# Patient Record
Sex: Male | Born: 1985 | Hispanic: No | Marital: Married | Smoking: Former smoker
Health system: Southern US, Community
[De-identification: ages and names within clinical notes are randomized; demographics above are authoritative.]

## PROBLEM LIST (undated history)

## (undated) DIAGNOSIS — I10 Essential (primary) hypertension: Secondary | ICD-10-CM

## (undated) DIAGNOSIS — R7401 Elevation of levels of liver transaminase levels: Secondary | ICD-10-CM

## (undated) DIAGNOSIS — R740 Nonspecific elevation of levels of transaminase and lactic acid dehydrogenase [LDH]: Secondary | ICD-10-CM

## (undated) DIAGNOSIS — E785 Hyperlipidemia, unspecified: Secondary | ICD-10-CM

## (undated) DIAGNOSIS — K625 Hemorrhage of anus and rectum: Secondary | ICD-10-CM

## (undated) HISTORY — DX: Hemorrhage of anus and rectum: K62.5

## (undated) HISTORY — DX: Nonspecific elevation of levels of transaminase and lactic acid dehydrogenase (ldh): R74.0

## (undated) HISTORY — PX: TONSILLECTOMY: SUR1361

## (undated) HISTORY — DX: Essential (primary) hypertension: I10

## (undated) HISTORY — DX: Elevation of levels of liver transaminase levels: R74.01

## (undated) HISTORY — DX: Hyperlipidemia, unspecified: E78.5

---

## 2011-01-19 DIAGNOSIS — E7800 Pure hypercholesterolemia, unspecified: Secondary | ICD-10-CM | POA: Insufficient documentation

## 2013-06-13 DIAGNOSIS — K649 Unspecified hemorrhoids: Secondary | ICD-10-CM | POA: Insufficient documentation

## 2013-06-13 DIAGNOSIS — R7989 Other specified abnormal findings of blood chemistry: Secondary | ICD-10-CM | POA: Insufficient documentation

## 2014-04-06 DIAGNOSIS — I10 Essential (primary) hypertension: Secondary | ICD-10-CM | POA: Insufficient documentation

## 2015-10-23 DIAGNOSIS — B009 Herpesviral infection, unspecified: Secondary | ICD-10-CM | POA: Insufficient documentation

## 2017-05-25 DIAGNOSIS — M542 Cervicalgia: Secondary | ICD-10-CM | POA: Insufficient documentation

## 2018-03-15 DIAGNOSIS — E782 Mixed hyperlipidemia: Secondary | ICD-10-CM | POA: Diagnosis not present

## 2018-03-15 DIAGNOSIS — I10 Essential (primary) hypertension: Secondary | ICD-10-CM | POA: Diagnosis not present

## 2018-03-21 DIAGNOSIS — Z0000 Encounter for general adult medical examination without abnormal findings: Secondary | ICD-10-CM | POA: Diagnosis not present

## 2018-03-21 DIAGNOSIS — M542 Cervicalgia: Secondary | ICD-10-CM | POA: Diagnosis not present

## 2018-03-21 DIAGNOSIS — M25511 Pain in right shoulder: Secondary | ICD-10-CM | POA: Diagnosis not present

## 2018-03-21 DIAGNOSIS — R0683 Snoring: Secondary | ICD-10-CM | POA: Diagnosis not present

## 2018-06-01 ENCOUNTER — Ambulatory Visit: Payer: 59 | Admitting: Neurology

## 2018-06-01 ENCOUNTER — Encounter: Payer: Self-pay | Admitting: Neurology

## 2018-06-01 VITALS — BP 127/92 | HR 86 | Ht 65.0 in | Wt 159.0 lb

## 2018-06-01 DIAGNOSIS — G478 Other sleep disorders: Secondary | ICD-10-CM

## 2018-06-01 DIAGNOSIS — E663 Overweight: Secondary | ICD-10-CM | POA: Diagnosis not present

## 2018-06-01 DIAGNOSIS — R0681 Apnea, not elsewhere classified: Secondary | ICD-10-CM

## 2018-06-01 DIAGNOSIS — R0683 Snoring: Secondary | ICD-10-CM | POA: Diagnosis not present

## 2018-06-01 DIAGNOSIS — F514 Sleep terrors [night terrors]: Secondary | ICD-10-CM

## 2018-06-01 DIAGNOSIS — R51 Headache: Secondary | ICD-10-CM

## 2018-06-01 DIAGNOSIS — R519 Headache, unspecified: Secondary | ICD-10-CM

## 2018-06-01 NOTE — Progress Notes (Signed)
Subjective:    Patient ID: Curtis Stephens is a 32 y.o. male.  HPI     Curtis FoleySaima Aleynah Rocchio, MD, PhD [facility] [address], [address] [address] [place], [place] [address]  Dear Dr. Nicholos Stephens,  I saw your patient, Curtis Stephens, upon your kind request in my neurologic clinic today for initial consultation of his sleep disorder, in particular, concern for underlying obstructive sleep apnea. The patient is accompanied by his wife today. As you know, Mr. Curtis Stephens is a 32 year old right-handed gentleman with an underlying medical history of hypertension, smoking, hyperlipidemia, reflux disease, neck pain, right shoulder pain, and mildly overweight state, who reports snoring and witnessed apneas per wife's report. He denies frank sleepiness and tries to make enough time for sleep but he has noticed in the past few years that his sleep is not as restful.he has also gained weight in the past couple of years. I reviewed your office note from 03/21/2018, which you kindly included. He is married and lives with his wife, works from home. They have no children. He smokes 4-5 cigarettes a week. He does not utilize alcohol currently. He drinks caffeine in the form of coffee, 1-2 cups per day on average. His Epworth sleepiness score is 6 out of 24, fatigue score is 26 out of 63. He has a dry mouth in AM.  He has no known FHx of OSA. He denies night to night nocturia but has had occasional morning headaches. He also has neck pain. He is trying to find the right pillow. He has woken up in the state of panic and confusion. This happens fairly frequently especially if his wife goes to bed after him and he is awakened or disturbed by any noise, he will yell out can have a blank stare, and if anything will recall the state of panic but no details in the mornings. He does dream. He tries to make enough time for sleep, generally, he is in bed by 10 or 10:30, rise time is around 7 or 7:30. They don't have any  pets, he does not watch TV in the bedroom. He had a tonsillectomy as a child, does not endorse restless leg symptoms and is not known to twitch or kick in his sleep. He does have occasional nasal congestion.  His Past Medical History Is Significant For: Past Medical History:  Diagnosis Date  . Elevated transaminase level   . Hyperlipidemia   . Hypertension   . Rectal bleeding     His Past Surgical History Is Significant For:  His Family History Is Significant For: Family History  Problem Relation Age of Onset  . Diabetes Father   . Hypertension Father     His Social History Is Significant For: Social History   Socioeconomic History  . Marital status: Married    Spouse name: Not on file  . Number of children: Not on file  . Years of education: Not on file  . Highest education level: Not on file  Occupational History  . Not on file  Social Needs  . Financial resource strain: Not on file  . Food insecurity:    Worry: Not on file    Inability: Not on file  . Transportation needs:    Medical: Not on file    Non-medical: Not on file  Tobacco Use  . Smoking status: Current Some Day Smoker  . Smokeless tobacco: Never Used  Substance and Sexual Activity  . Alcohol use: Not Currently  . Drug use:  Never  . Sexual activity: Not on file  Lifestyle  . Physical activity:    Days per week: Not on file    Minutes per session: Not on file  . Stress: Not on file  Relationships  . Social connections:    Talks on phone: Not on file    Gets together: Not on file    Attends religious service: Not on file    Active member of club or organization: Not on file    Attends meetings of clubs or organizations: Not on file    Relationship status: Not on file  Other Topics Concern  . Not on file  Social History Narrative  . Not on file    His Allergies Are:  Allergies  Allergen Reactions  . Nsaids   . Crestor [Rosuvastatin Calcium]   :   His Current Medications Are:   Outpatient Encounter Medications as of 06/01/2018  Medication Sig  . ezetimibe (ZETIA) 10 MG tablet Take 10 mg by mouth daily.  Marland Kitchen lisinopril (PRINIVIL,ZESTRIL) 20 MG tablet Take 20 mg by mouth daily.  Marland Kitchen omeprazole (PRILOSEC) 40 MG capsule Take 40 mg by mouth daily.   No facility-administered encounter medications on file as of 06/01/2018.   :  Review of Systems:  Out of a complete 14 point review of systems, all are reviewed and negative with the exception of these symptoms as listed below: Review of Systems  Neurological:       Pt presents today to discuss his sleep. Pt has never had a sleep study but does endorse snoring.  Epworth Sleepiness Scale 0= would never doze 1= slight chance of dozing 2= moderate chance of dozing 3= high chance of dozing  Sitting and reading: 3 Watching TV: 1 Sitting inactive in a public place (ex. Theater or meeting): 0 As a passenger in a car for an hour without a break: 0 Lying down to rest in the afternoon: 2 Sitting and talking to someone: 0 Sitting quietly after lunch (no alcohol): 0 In a car, while stopped in traffic: 0 Total: 6     Objective:  Neurological Exam  Physical Exam Physical Examination:   Vitals:   06/01/18 1327  BP: (!) 127/92  Pulse: 86    General Examination: The patient is a very pleasant 32 y.o. male in no acute distress. He appears well-developed and well-nourished and well groomed.   HEENT: Normocephalic, atraumatic, pupils are equal, round and reactive to light and accommodation. Corrective eyeglasses in place. Extraocular tracking is good without limitation to gaze excursion or nystagmus noted. Normal smooth pursuit is noted. Hearing is grossly intact. Face is symmetric with normal facial animation and normal facial sensation. Speech is clear with no dysarthria noted. There is no hypophonia. There is no lip, neck/head, jaw or voice tremor. Neck is supple with full range of passive and active motion. There are no  carotid bruits on auscultation. Oropharynx exam reveals: mild mouth dryness, adequate dental hygiene and moderate airway crowding, due to small airway entryAnd larger uvula. Mallampati is class II. Tonsils are absent. Neck circumference is 16-1/2 inches.He has a minimal overbite, slight malocclusion. On nasal inspection he has no significant deviated septum but some mucosal swelling and nasal congestion noted, right much more than left. He reports that he currently has a mild cold.  Chest: Clear to auscultation without wheezing, rhonchi or crackles noted.  Heart: S1+S2+0, regular and normal without murmurs, rubs or gallops noted.   Abdomen: Soft, non-tender and non-distended with normal  bowel sounds appreciated on auscultation.  Extremities: There is no pitting edema in the distal lower extremities bilaterally.  Skin: Warm and dry without trophic changes noted.  Musculoskeletal: exam reveals no obvious joint deformities, tenderness or joint swelling or erythema.   Neurologically:  Mental status: The patient is awake, alert and oriented in all 4 spheres. His immediate and remote memory, attention, language skills and fund of knowledge are appropriate. There is no evidence of aphasia, agnosia, apraxia or anomia. Speech is clear with normal prosody and enunciation. Thought process is linear. Mood is normal and affect is normal.  Cranial nerves II - XII are as described above under HEENT exam. In addition: shoulder shrug is normal with equal shoulder height noted. Motor exam: Normal bulk, strength and tone is noted. There is no drift, tremor or rebound. Romberg is negative. Fine motor skills and coordination: intact grossly.   Cerebellar testing: No dysmetria or intention tremor. There is no truncal or gait ataxia.  Sensory exam: intact to light touch in the upper and lower extremities.  Gait, station and balance: He stands easily. No veering to one side is noted. No leaning to one side is noted.  Posture is age-appropriate and stance is narrow based. Gait shows normal stride length and normal pace. No problems turning are noted. tandem walk is unremarkable.   Assessment and Plan:  In summary, Bosco Curtis Stephens is a very pleasant 32 y.o.-year old male with an underlying medical history of hypertension, smoking, hyperlipidemia, reflux disease, neck pain, right shoulder pain, and mildly overweight state, whose history and physical exam are concerning for obstructive sleep apnea (OSA). I had a long chat with the patient and his wife about my findings and the diagnosis of OSA, its prognosis and treatment options. We talked about medical treatments, surgical interventions and non-pharmacological approaches. I explained in particular the risks and ramifications of untreated moderate to severe OSA, especially with respect to developing cardiovascular disease down the Road, including congestive heart failure, difficult to treat hypertension, cardiac arrhythmias, or stroke. Even type 2 diabetes has, in part, been linked to untreated OSA. Symptoms of untreated OSA include daytime sleepiness, memory problems, mood irritability and mood disorder such as depression and anxiety, lack of energy, as well as recurrent headaches, especially morning headaches. We talked about the importance of smoking cessation, and trying to maintain a healthy lifestyle in general, as well as the importance of weight control. I encouraged the patient to eat healthy, exercise daily and keep well hydrated, to keep a scheduled bedtime and wake time routine, to not skip any meals and eat healthy snacks in between meals. I advised the patient not to drive when feeling sleepy. I recommended the following at this time: sleep study with potential positive airway pressure titration. (We will score hypopneas at 4%).   I explained the sleep test procedure to the patient and also outlined possible surgical and non-surgical treatment options of OSA,  including the use of a custom-made dental device (which would require a referral to a specialist dentist or oral surgeon), upper airway surgical options, such as pillar implants, radiofrequency surgery, tongue base surgery, and UPPP (which would involve a referral to an ENT surgeon). Rarely, jaw surgery such as mandibular advancement may be considered.  I also explained the CPAP treatment option to the patient, who indicated that he would be willing to try CPAP if the need arises. I explained the importance of being compliant with PAP treatment, not only for insurance purposes but primarily to  improve His symptoms, and for the patient's long term health benefit, including to reduce His cardiovascular risks. I answered all their questions today and the patient and his wife were in agreement. I plan to see him back after the sleep study is completed and encouraged him to call with any interim questions, concerns, problems or updates.   Thank you very much for allowing me to participate in the care of this nice patient. If I can be of any further assistance to you please do not hesitate to call me at [phone].  Sincerely,   Curtis Foley, MD, PhD

## 2018-06-01 NOTE — Patient Instructions (Addendum)

## 2018-06-02 ENCOUNTER — Telehealth: Payer: Self-pay

## 2018-06-02 DIAGNOSIS — R0681 Apnea, not elsewhere classified: Secondary | ICD-10-CM

## 2018-06-02 NOTE — Telephone Encounter (Signed)
[employer] will deny in lab sleep study, does not meet critria. Need HST order

## 2018-06-02 NOTE — Telephone Encounter (Signed)
VO for HST from Dr. Athar received. HST order placed.  

## 2018-06-22 ENCOUNTER — Ambulatory Visit: Payer: 59 | Admitting: Neurology

## 2018-06-22 DIAGNOSIS — G4733 Obstructive sleep apnea (adult) (pediatric): Secondary | ICD-10-CM | POA: Diagnosis not present

## 2018-06-22 DIAGNOSIS — R0681 Apnea, not elsewhere classified: Secondary | ICD-10-CM

## 2018-06-30 ENCOUNTER — Telehealth: Payer: Self-pay

## 2018-06-30 NOTE — Telephone Encounter (Signed)
I called pt to discuss his sleep study results. Pt is agreeable to starting auto pap, if his insurance will cover it. He asked me to send the order to a DME to check insurance. Pt declined an appt right now, even though I advised him that he must follow up 31-90 days after starting PAP therapy. Pt will call me if he decides to start auto pap. Alternative treatments for osa other than auto pap were discussed with the pt as well. Pt is asking to pick up a copy of his sleep study report to review the data and recommendations for himself. He again was asked to call and let me know of his decision regarding treatment. Pt verbalized understanding of results. I have placed a copy of pt's sleep study at the front desk for pick up and sent the referral for auto pap to [facility].

## 2018-06-30 NOTE — Progress Notes (Signed)
Patient referred by Dr. Nicholos Johnsamachandran, seen by me on 06/01/18, HST on 06/22/18.    Please call and notify the patient that the recent home sleep test showed obstructive sleep apnea. OSA is in the moderate range, but desaturations were mild. I would like for him to consider treatment with autoPAP to see, if he feels better after treatment. To that end I recommend treatment for this in the form of autoPAP, which means, that we don't have to bring him in for a sleep study with CPAP, but will let him try an autoPAP machine at home, through a DME company (of his choice, or as per insurance requirement). The DME representative will educate him on how to use the machine, how to put the mask on, etc. I have placed an order in the chart, in case he wants to proceed. Other OSA treatment options may include avoidance of supine sleep position along with weight loss, upper airway or jaw surgery in selected patients or the use of an oral appliance in certain patients. Realistically, since he is not obese, a dental device can be considered as an alternative to autoPAP.  Please send referral, talk to patient, send report to referring MD. We will need a FU in sleep clinic for 10 weeks post-PAP set up, please arrange that with me or one of our NPs. Thanks,   Huston FoleySaima Stella Encarnacion, MD, PhD [facility])

## 2018-06-30 NOTE — Addendum Note (Signed)
Addended by: Huston FoleyATHAR, Shelanda Duvall on: 06/30/2018 08:26 AM   Modules accepted: Orders

## 2018-06-30 NOTE — Procedures (Signed)
[facility] [address] [place], [place] [address] NAME: Curtis Pancoastatel Curtis Stephens                                                                 DOB: 08/16/1986 MEDICAL RECORD NUMBER [id number]                                          DOS: 06/22/2018   REFERRING PHYSICIAN: Georgianne FickAjith Ramachandran, MD STUDY PERFORMED: Home Sleep Test HISTORY: 32 year old man with a history of hypertension, smoking, hyperlipidemia, reflux disease, neck pain, right shoulder pain, and mildly overweight state, who reports snoring and witnessed apneas per wife's report. His Epworth sleepiness score is 6 out of 24, BMI: 26.4.  STUDY RESULTS:  Total Recording Time: 8 hours, 45 minutes (valid test time: 7 hours, 29 min) Total Apnea/Hypopnea Index (AHI): 21.7/h, RDI: 30.7/h Average Oxygen Saturation: 94%, Lowest Oxygen Desaturation: 88%   Total Time Oxygen Saturation Below or at 88%: 0.2 minutes  Average Heart Rate: 94 bpm (between 88 and 100 bpm) IMPRESSION: OSA  RECOMMENDATION: This home sleep test demonstrates moderate obstructive sleep apnea (by number of events) with a total AHI of 21.7/hour and O2 nadir of 88%. Given the patient's medical history and sleep related complaints, treatment with positive airway pressure is recommended. This can be achieved in the form of an autoPAP titration/trial at home. Other OSA treatment options may include avoidance of supine sleep position along with weight loss, upper airway or jaw surgery in selected patients or the use of an oral appliance in certain patients. ENT evaluation and/or consultation with a maxillofacial surgeon or dentist may be feasible in some instances. Please note that untreated obstructive sleep apnea may carry additional perioperative morbidity. Patients with significant obstructive sleep apnea should receive perioperative PAP therapy and the surgeons and particularly the anesthesiologist should be informed of the diagnosis and the severity of the  sleep disordered breathing. The patient should be cautioned not to drive, work at heights, or operate dangerous or heavy equipment when tired or sleepy. Review and reiteration of good sleep hygiene measures should be pursued with any patient. Other causes of the patient's symptoms, including circadian rhythm disturbances, an underlying mood disorder, medication effect and/or an underlying medical problem cannot be ruled out based on this test. Clinical correlation is recommended. The patient and his referring provider will be notified of the test results. The patient [facility]. I certify that I have reviewed the raw data recording prior to the issuance of this report in accordance with the standards of the American Academy of Sleep Medicine (AASM).  Huston FoleySaima Machelle Raybon, MD, PhD [facility]) Diplomat, ABPN (Neurology and Sleep)

## 2018-06-30 NOTE — Telephone Encounter (Signed)
-----   Message from Huston FoleySaima Athar, MD sent at 06/30/2018  8:25 AM EDT ----- Patient referred by Dr. Nicholos Johnsamachandran, seen by me on 06/01/18, HST on 06/22/18.    Please call and notify the patient that the recent home sleep test showed obstructive sleep apnea. OSA is in the moderate range, but desaturations were mild. I would like for him to consider treatment with autoPAP to see, if he feels better after treatment. To that end I recommend treatment for this in the form of autoPAP, which means, that we don't have to bring him in for a sleep study with CPAP, but will let him try an autoPAP machine at home, through a DME company (of his choice, or as per insurance requirement). The DME representative will educate him on how to use the machine, how to put the mask on, etc. I have placed an order in the chart, in case he wants to proceed. Other OSA treatment options may include avoidance of supine sleep position along with weight loss, upper airway or jaw surgery in selected patients or the use of an oral appliance in certain patients. Realistically, since he is not obese, a dental device can be considered as an alternative to autoPAP.  Please send referral, talk to patient, send report to referring MD. We will need a FU in sleep clinic for 10 weeks post-PAP set up, please arrange that with me or one of our NPs. Thanks,   Huston FoleySaima Athar, MD, PhD [facility])

## 2018-07-01 ENCOUNTER — Telehealth: Payer: Self-pay | Admitting: Neurology

## 2018-07-01 NOTE — Telephone Encounter (Signed)
Patient has questions about his sleep study. Please call [phone]

## 2018-07-04 NOTE — Telephone Encounter (Signed)
I called pt to discuss. No answer, left a message asking him to call me back. 

## 2018-07-06 NOTE — Telephone Encounter (Signed)
I called pt. He has questions about sleep apnea and how a cpap treats osa, which I answered for him. He asked what lifestyle changes he could make to help his osa. I recommended avoidance of supine sleep position and maintaining a healthy weight. Pt has many more questions which I cannot address, such as what size is his airway and how much does that contribute to his osa, etc. Pt plans on starting cpap but wants an appt with Dr. Frances FurbishAthar and asked me to call him if an appt opens up. I will call pt if an appt opens up. Pt verbalized understanding.

## 2018-07-06 NOTE — Telephone Encounter (Signed)
I called pt and offered him 2 available appts for Monday with Dr. Frances FurbishAthar but pt cannot make either of those appts.

## 2018-07-07 NOTE — Telephone Encounter (Signed)
I called pt offer him an appt next week on Wednesday with Dr. Frances FurbishAthar. Pt reports that he is on the other line with [facility] and will call us back next week to discuss his cpap and scheduling an appt. Pt declined appts on Wednesday with Dr. Frances FurbishAthar at this time.

## 2019-11-13 ENCOUNTER — Other Ambulatory Visit (HOSPITAL_COMMUNITY): Payer: Self-pay | Admitting: Internal Medicine

## 2019-11-13 ENCOUNTER — Other Ambulatory Visit: Payer: Self-pay | Admitting: Internal Medicine

## 2019-11-13 DIAGNOSIS — R519 Headache, unspecified: Secondary | ICD-10-CM

## 2019-11-17 ENCOUNTER — Ambulatory Visit (HOSPITAL_COMMUNITY)
Admission: RE | Admit: 2019-11-17 | Discharge: 2019-11-17 | Disposition: A | Discharge status: Home / Self Care | Payer: 59 | Source: Ambulatory Visit | Attending: Internal Medicine | Admitting: Internal Medicine

## 2019-11-17 ENCOUNTER — Other Ambulatory Visit: Payer: Self-pay

## 2019-11-17 ENCOUNTER — Ambulatory Visit (HOSPITAL_COMMUNITY): Payer: 59

## 2019-11-17 DIAGNOSIS — R519 Headache, unspecified: Secondary | ICD-10-CM | POA: Diagnosis present

## 2019-11-17 IMAGING — CT CT HEAD W/O CM
4 series · 16 of 47 positions shown, 18 images · non-contrast
Comparison: None.

CLINICAL DATA: Headache

EXAM:
CT HEAD WITHOUT CONTRAST
TECHNIQUE: Contiguous axial images were obtained from the base of the skull
through the vertex without intravenous contrast.

[Series 2: head without · axial · non-contrast · 0.48mm/px · z∈[-92,+28]mm · 7 of 32 slices shown, 9 images]
[im 4/32  brain]
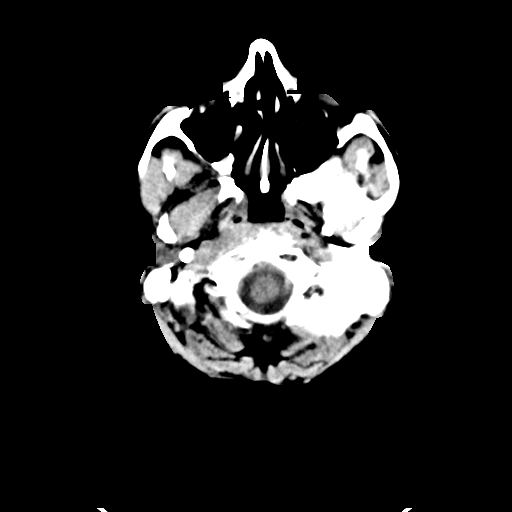
[im 4/32  bone]
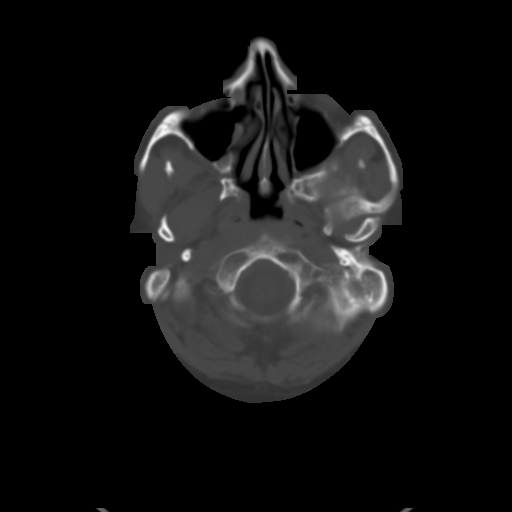
[im 8/32  brain]
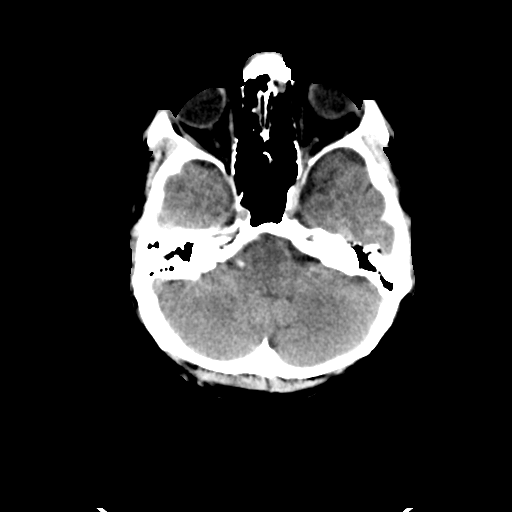
[im 12/32  brain]
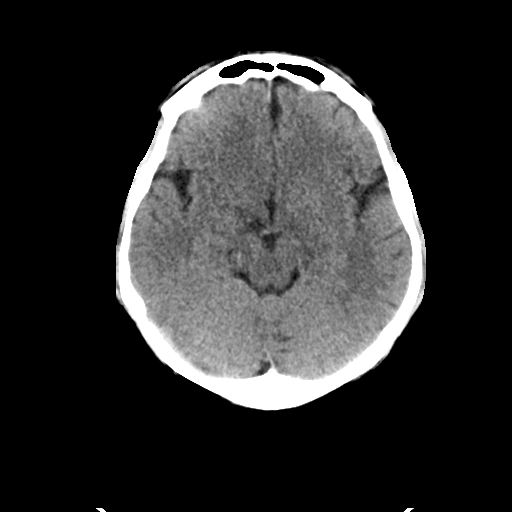
[im 16/32  brain]
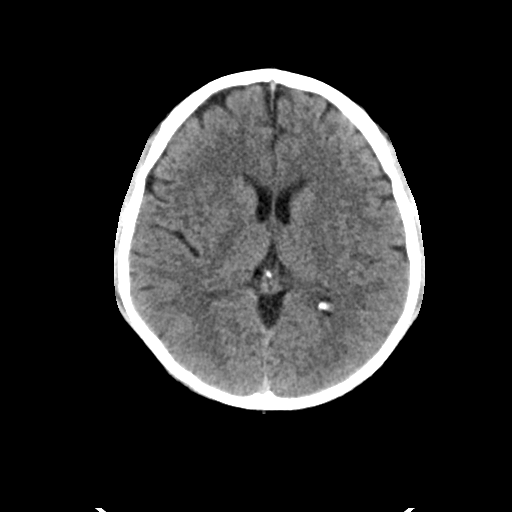
[im 20/32  brain]
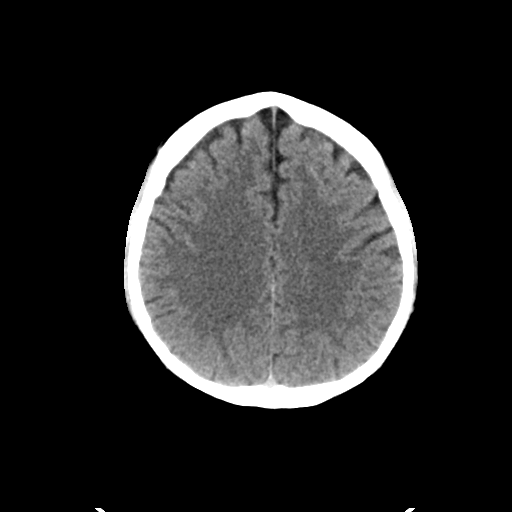
[im 20/32  bone]
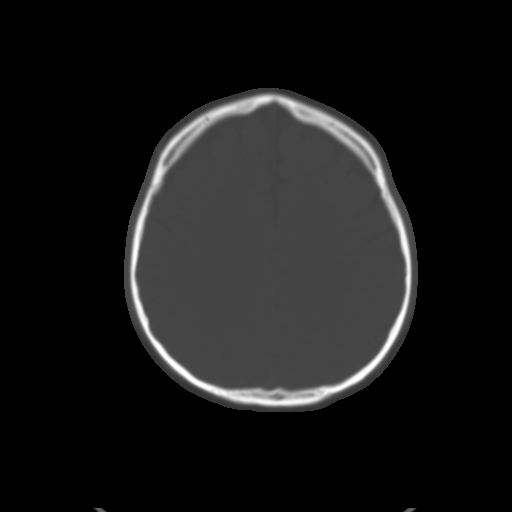
[im 24/32  brain]
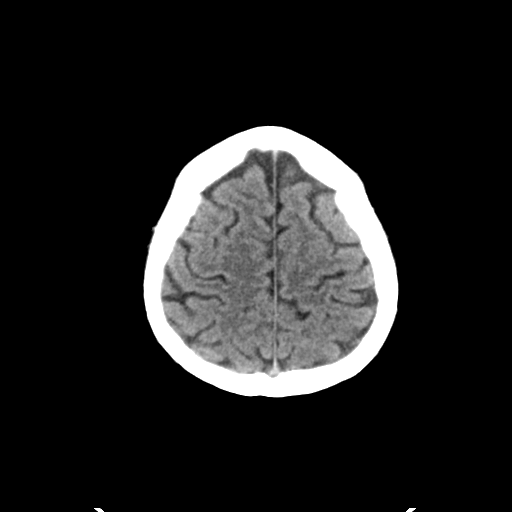
[im 28/32  brain]
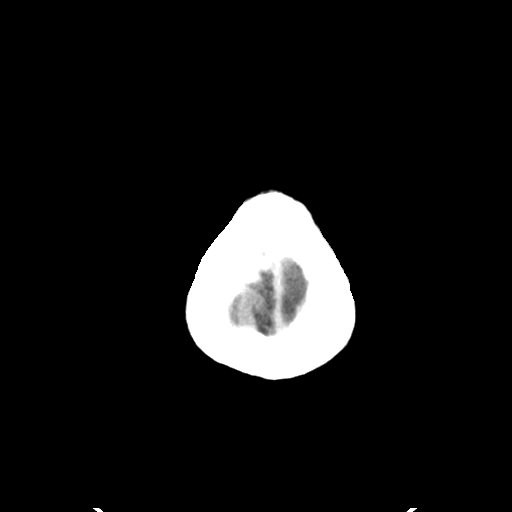

[Series 3: head bone · axial · 0.48mm/px · z∈[-93,-61]mm · 3 of 80 slices shown]
[im 8/80  bone]
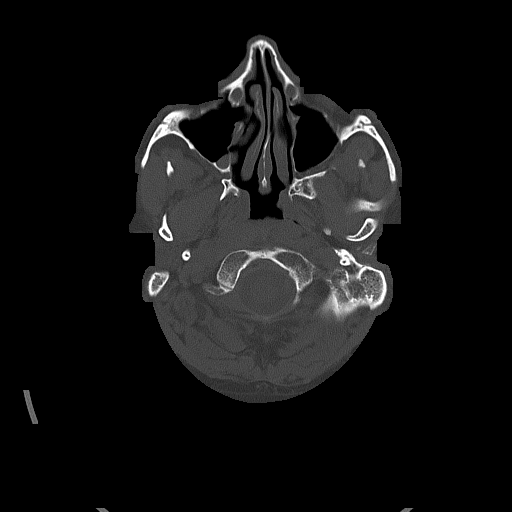
[im 16/80  bone]
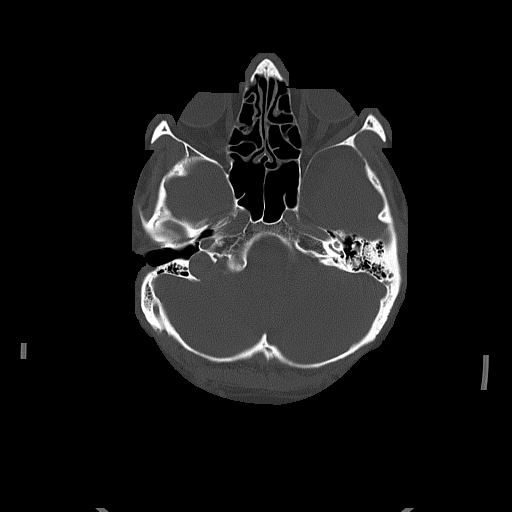
[im 24/80  bone]
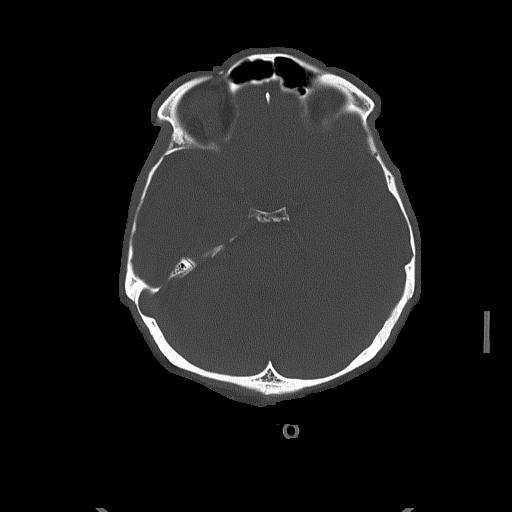

[Series 4: head without cor · coronal · non-contrast · 0.32mm/px · 3 of 69 slices shown]
[im 23/69  brain]
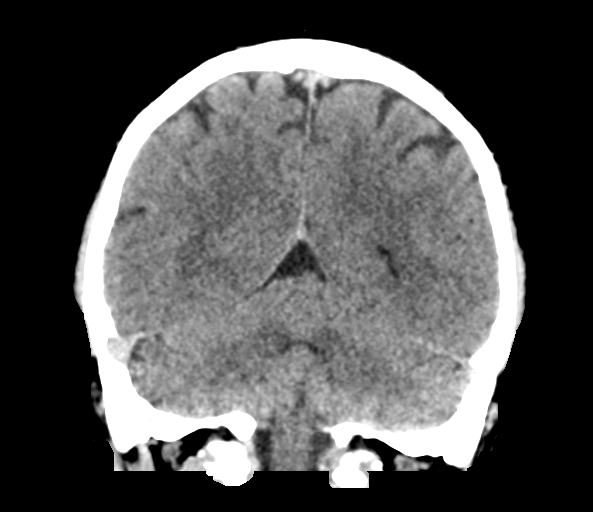
[im 31/69  brain]
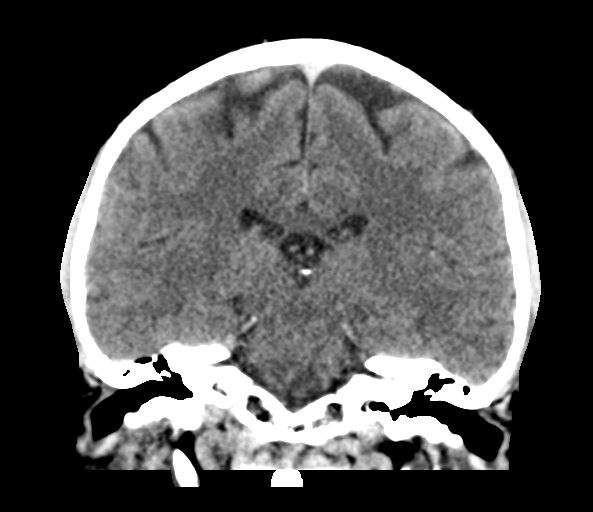
[im 38/69  brain]
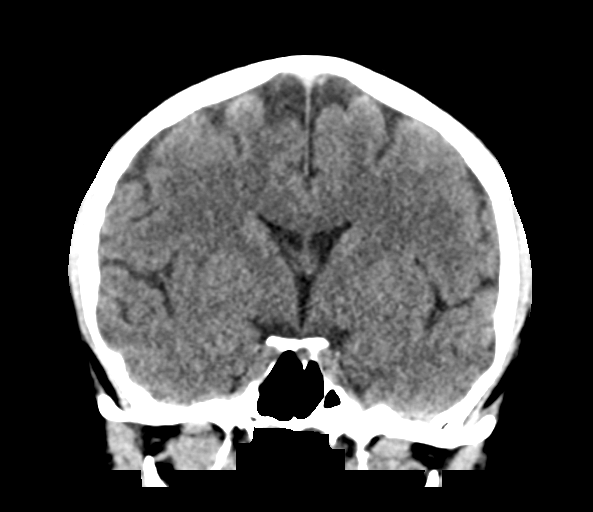

[Series 5: head without sag · sagittal · non-contrast · 0.32mm/px · 3 of 67 slices shown]
[im 23/67  brain]
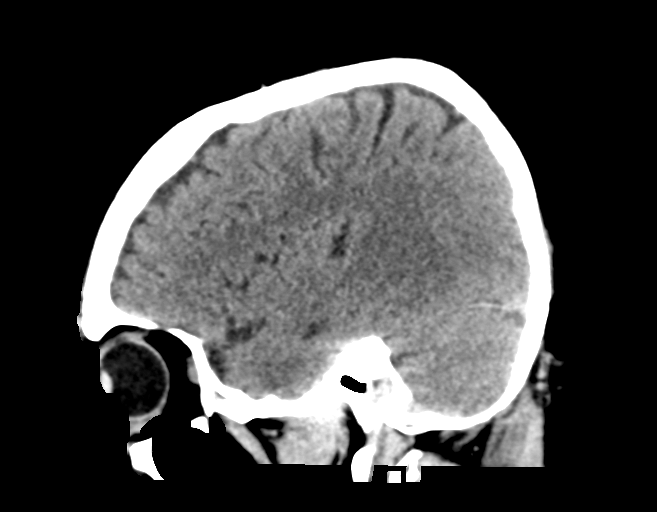
[im 34/67  brain]
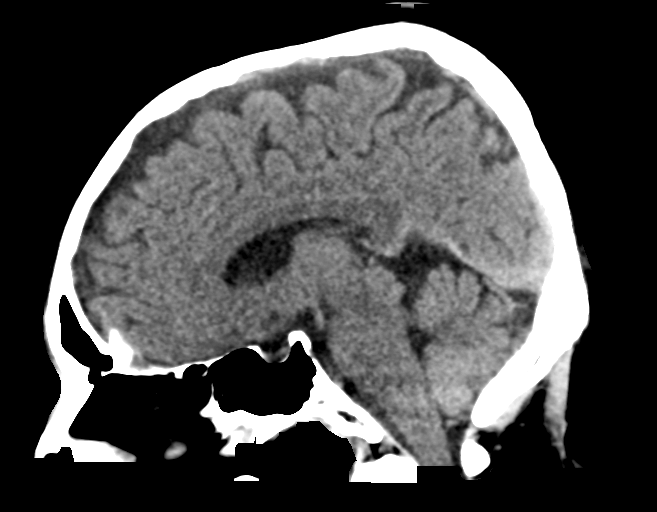
[im 45/67  brain]
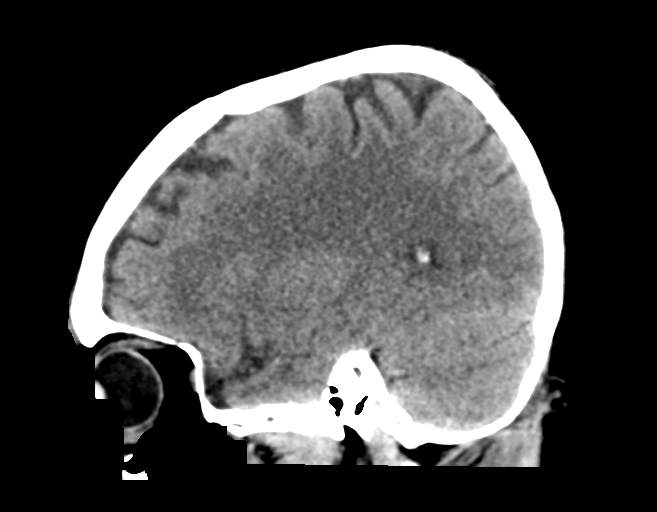

[16 of 47 positions shown; findings below may reference images not displayed]

FINDINGS: Brain: Ventricles and sulci are normal in size and configuration.
There is no intracranial mass, hemorrhage, extra-axial fluid
collection, or midline shift. Brain parenchyma appears unremarkable.
No acute infarct evident.

Vascular: No hyperdense vessel.  No evident vascular calcification.

Skull: Bony calvarium appears intact.

Sinuses/Orbits: There is an apparent retention cyst in the posterior
right maxillary antrum. There is opacification and mucosal
thickening in several ethmoid air cells. There is leftward deviation
of the nasal septum. Orbits appear symmetric bilaterally.

Other: Mastoid air cells are clear.
IMPRESSION: Areas of paranasal sinus disease. Deviated nasal septum. Study
otherwise unremarkable.

## 2019-11-21 ENCOUNTER — Other Ambulatory Visit: Payer: Self-pay | Admitting: Internal Medicine

## 2019-11-21 ENCOUNTER — Other Ambulatory Visit: Payer: 59

## 2019-11-21 DIAGNOSIS — R7989 Other specified abnormal findings of blood chemistry: Secondary | ICD-10-CM

## 2019-11-21 DIAGNOSIS — R945 Abnormal results of liver function studies: Secondary | ICD-10-CM

## 2019-11-28 ENCOUNTER — Ambulatory Visit
Admission: RE | Admit: 2019-11-28 | Discharge: 2019-11-28 | Disposition: A | Discharge status: Home / Self Care | Payer: 59 | Source: Ambulatory Visit | Attending: Internal Medicine | Admitting: Internal Medicine

## 2019-11-28 DIAGNOSIS — R7989 Other specified abnormal findings of blood chemistry: Secondary | ICD-10-CM

## 2019-11-28 DIAGNOSIS — R945 Abnormal results of liver function studies: Secondary | ICD-10-CM

## 2019-11-28 IMAGING — US US ABDOMEN COMPLETE
1 series · 14 of 25 positions shown · non-contrast
Comparison: None.

CLINICAL DATA: Elevated liver function tests.

EXAM:
ABDOMEN ULTRASOUND COMPLETE

[Series 1: us abdomen complete · 0.17mm/px · 14 of 75 slices shown]
[im 1/75]
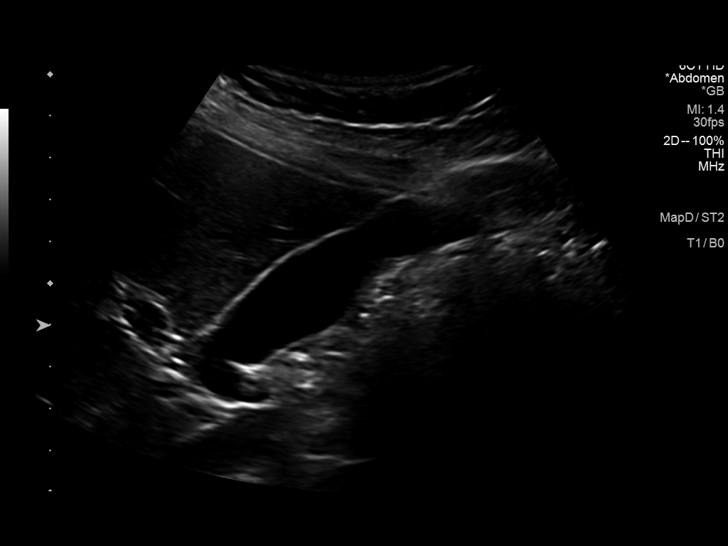
[im 7/75]
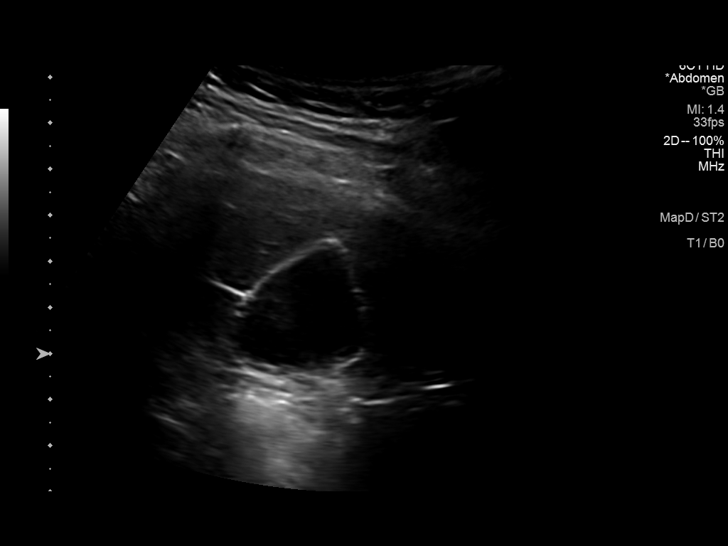
[im 13/75]
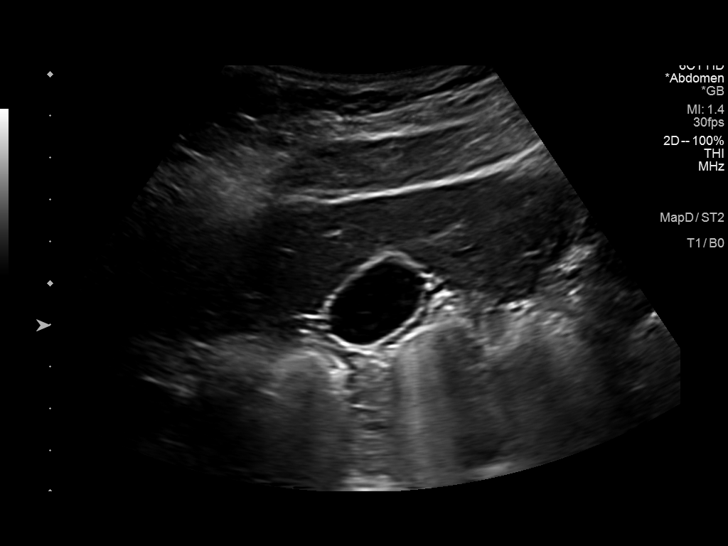
[im 19/75]
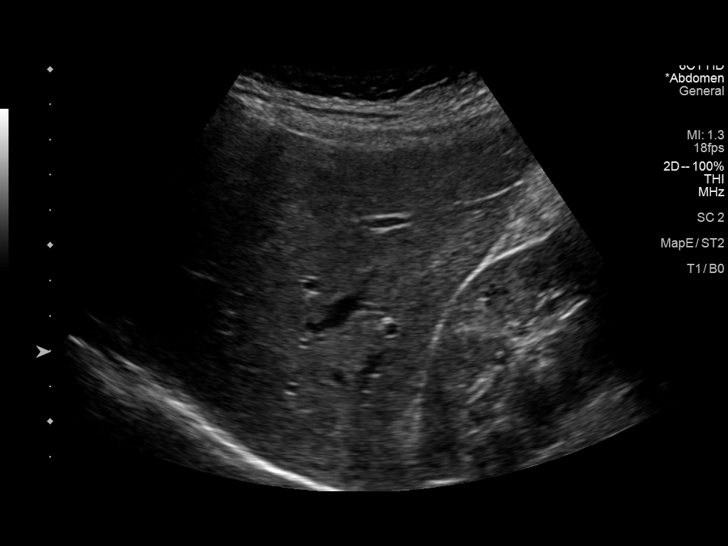
[im 25/75]
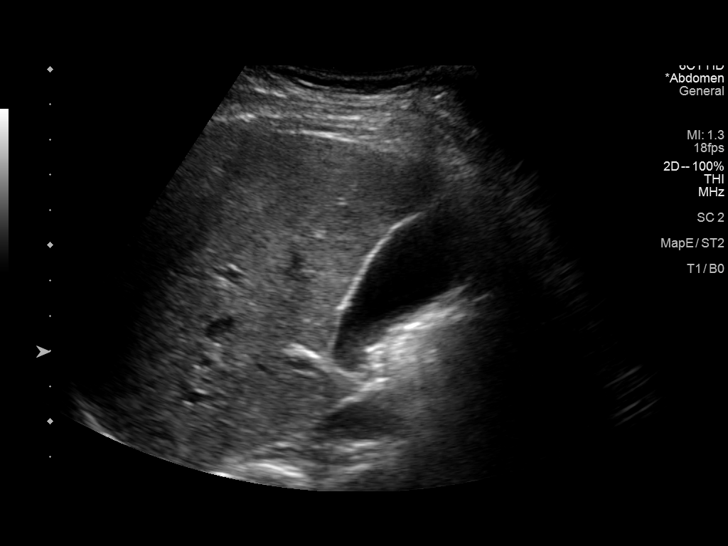
[im 28/75]
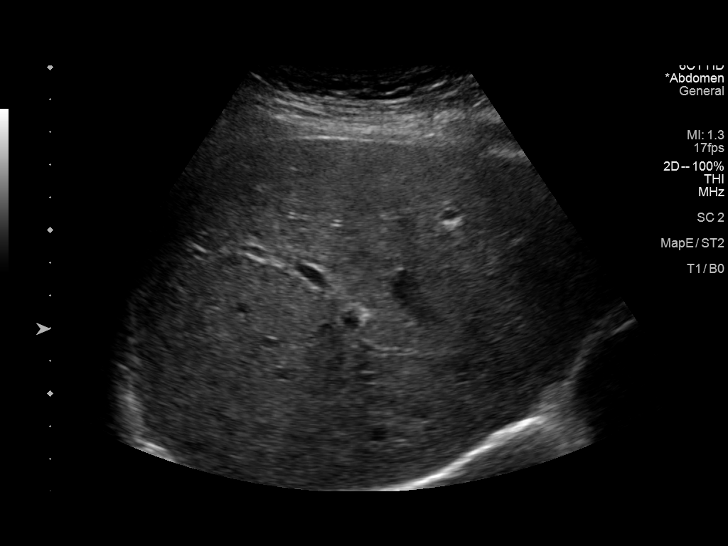
[im 34/75]
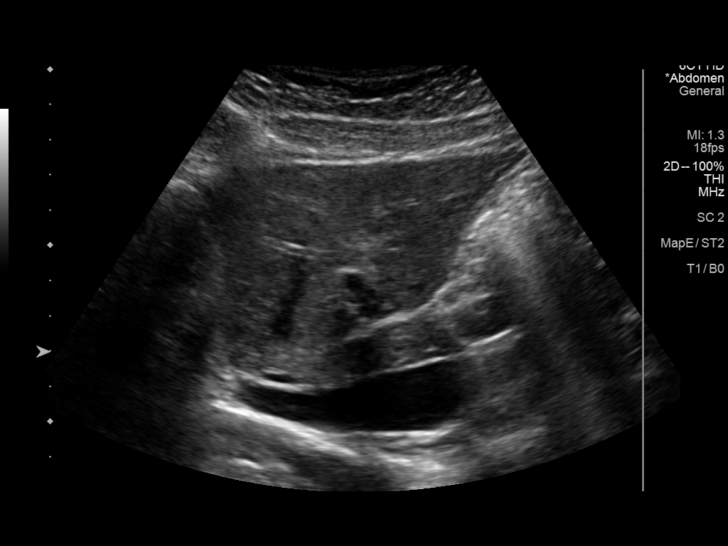
[im 41/75]
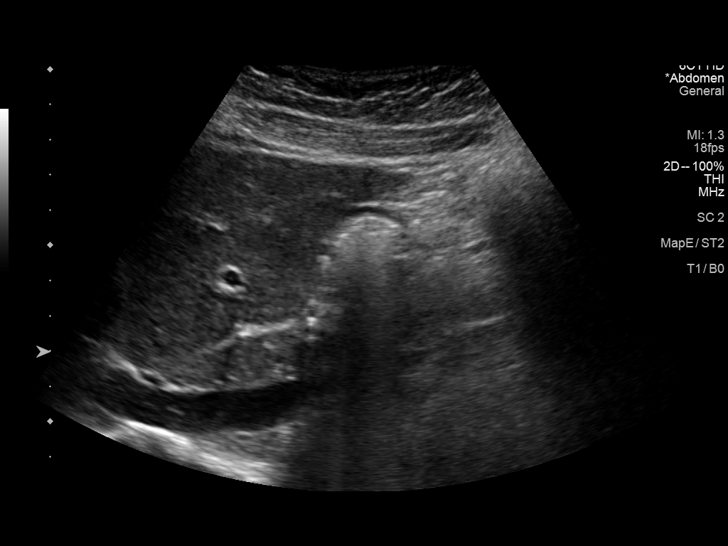
[im 47/75]
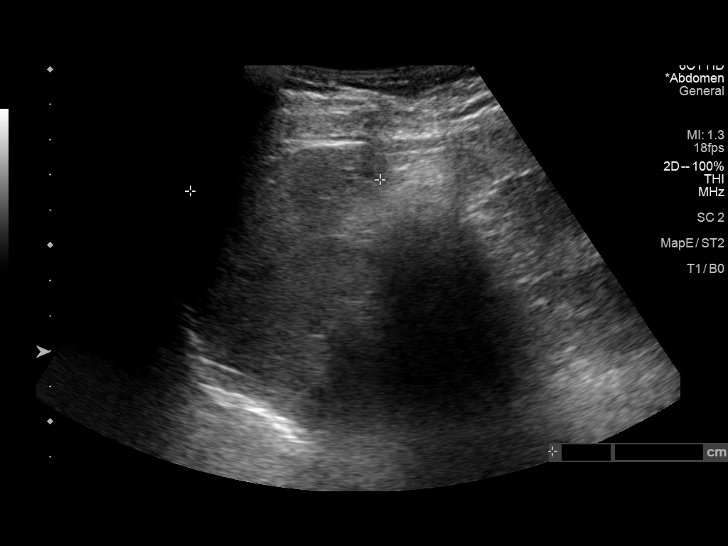
[im 50/75]
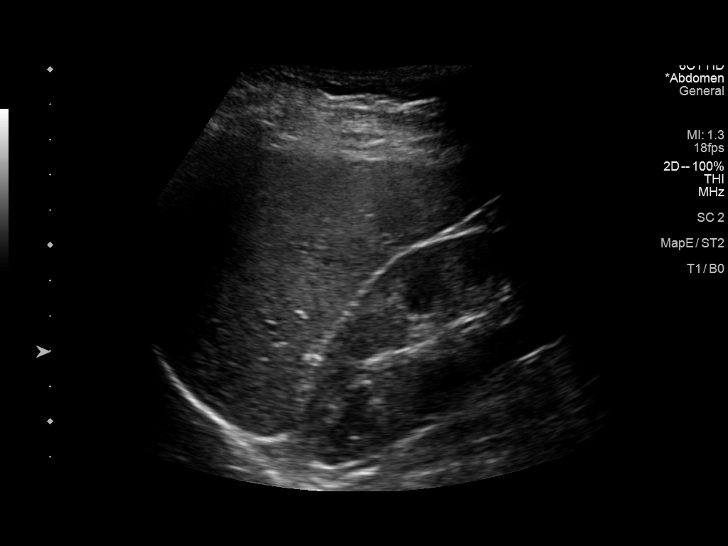
[im 56/75]
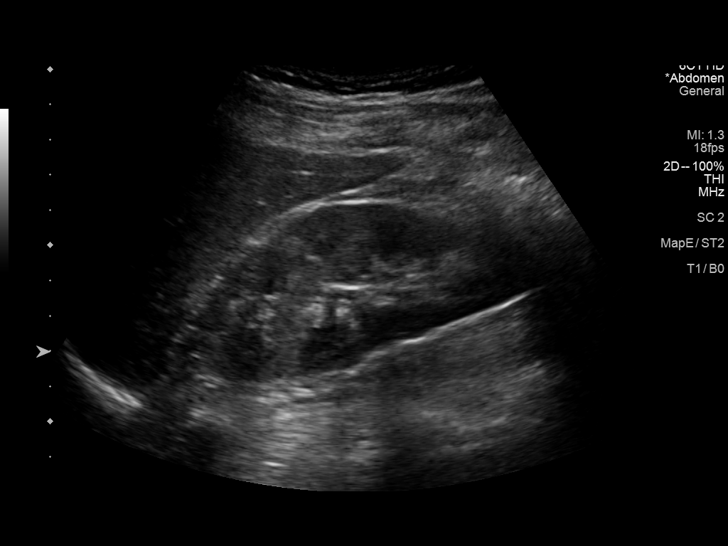
[im 62/75]
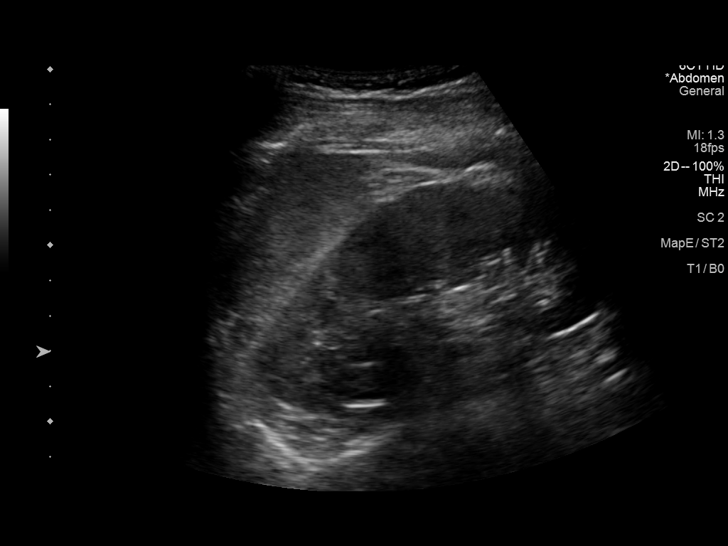
[im 68/75]
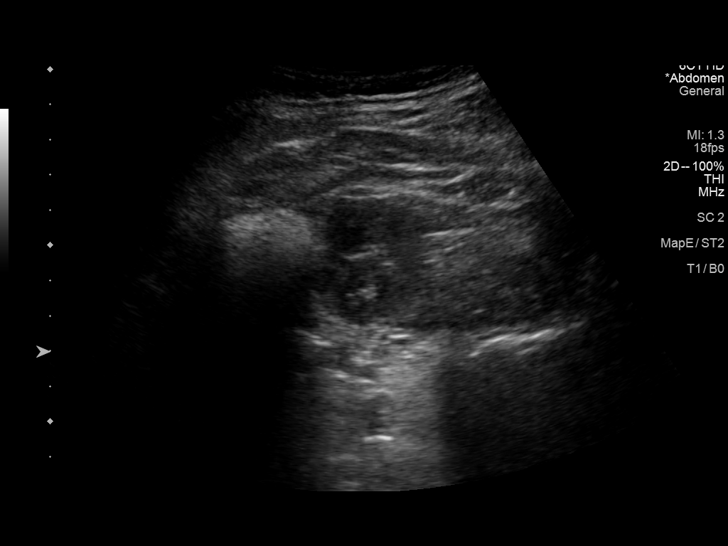
[im 75/75]
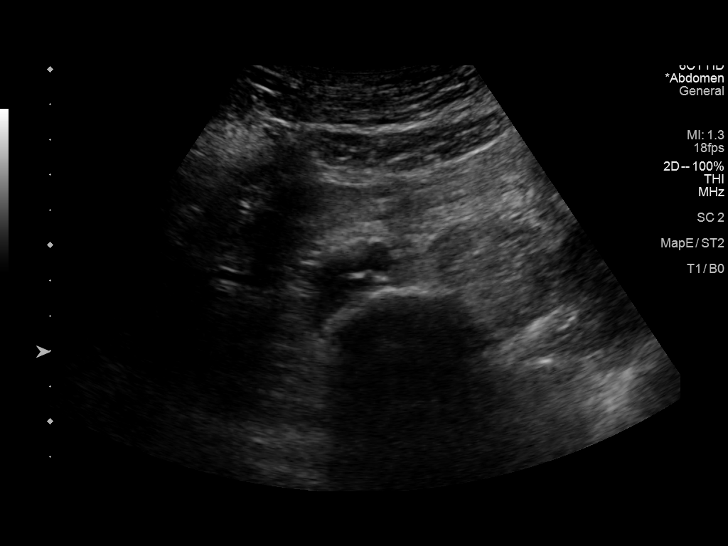

[14 of 25 positions shown; findings below may reference images not displayed]

FINDINGS: Gallbladder: No gallstones or wall thickening visualized. No
sonographic Murphy sign noted by sonographer.

Common bile duct: Diameter: 3.2 mm

Liver: No focal lesion identified. Within normal limits in
parenchymal echogenicity. Portal vein is patent on color Doppler
imaging with normal direction of blood flow towards the liver.

IVC: No abnormality visualized.

Pancreas: Visualized portion unremarkable.

Spleen: Size and appearance within normal limits.

Right Kidney: Length: 10.5 cm. Echogenicity within normal limits. No
mass or hydronephrosis visualized.

Left Kidney: Length: 11.0 cm. Echogenicity within normal limits. No
mass or hydronephrosis visualized.

Abdominal aorta: No aneurysm visualized.

Other findings: None.
IMPRESSION: Normal examination.

## 2021-03-31 DIAGNOSIS — M5412 Radiculopathy, cervical region: Secondary | ICD-10-CM | POA: Insufficient documentation

## 2021-10-23 ENCOUNTER — Other Ambulatory Visit: Payer: Self-pay | Admitting: Rheumatology

## 2021-10-23 DIAGNOSIS — M545 Low back pain, unspecified: Secondary | ICD-10-CM

## 2021-10-25 ENCOUNTER — Ambulatory Visit
Admission: RE | Admit: 2021-10-25 | Discharge: 2021-10-25 | Disposition: A | Discharge status: Home / Self Care | Payer: 59 | Source: Ambulatory Visit | Attending: Rheumatology | Admitting: Rheumatology

## 2021-10-25 ENCOUNTER — Other Ambulatory Visit: Payer: Self-pay

## 2021-10-25 DIAGNOSIS — M545 Low back pain, unspecified: Secondary | ICD-10-CM

## 2021-10-25 IMAGING — MR MR THORACIC SPINE WO/W CM
6 of 10 series · 25 of 48 positions shown · IV contrast (multihance)
Comparison: None.

CLINICAL DATA: 35-year-old male with persistent back pain for 3
weeks. Extremity pain and weakness.

EXAM:
MRI THORACIC WITHOUT AND WITH CONTRAST
TECHNIQUE: Multiplanar and multiecho pulse sequences of the thoracic spine were
obtained without and with intravenous contrast.
CONTRAST:  12mL MULTIHANCE GADOBENATE DIMEGLUMINE 529 MG/ML IV SOLN

[Series 2: T1 · sagittal · 3.0mm · 1.06mm/px · 1 of 7 slices shown (1 of 3)]
[im 1/7]
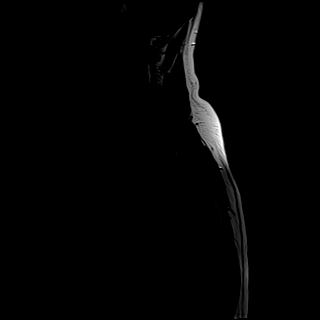

[Series 5: T1 · sagittal · 4.0mm · 0.47mm/px · 2 of 12 slices shown (2 of 3)]
[im 1/12]
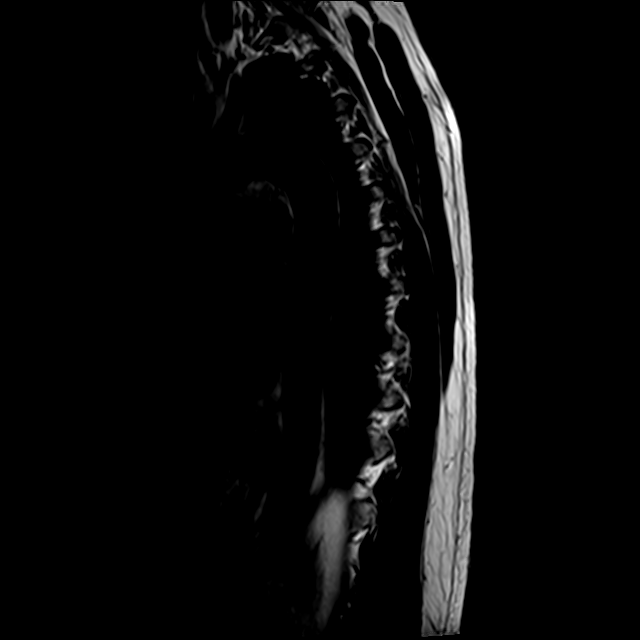
[im 12/12]
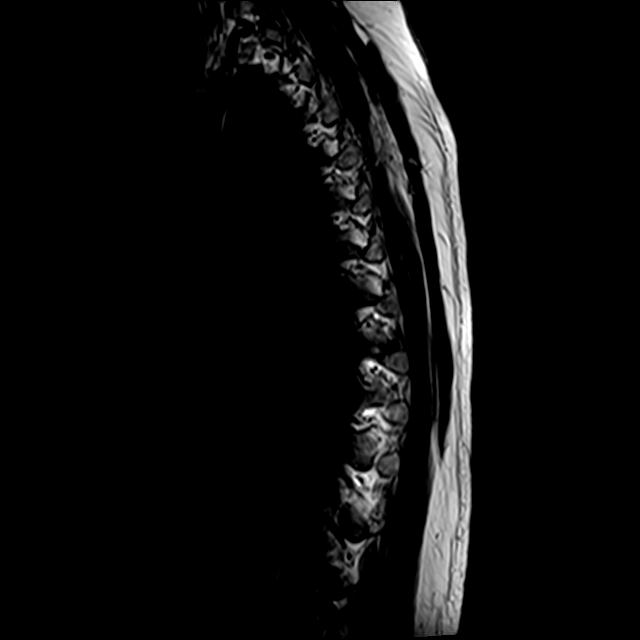

[Series 6: T2 post-contrast · sagittal · 4.0mm · 0.48mm/px · 2 of 12 slices shown]
[im 1/12]
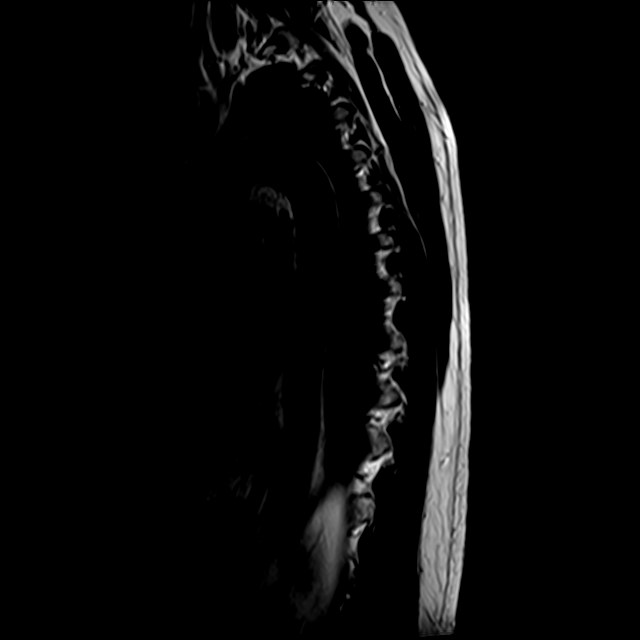
[im 12/12]
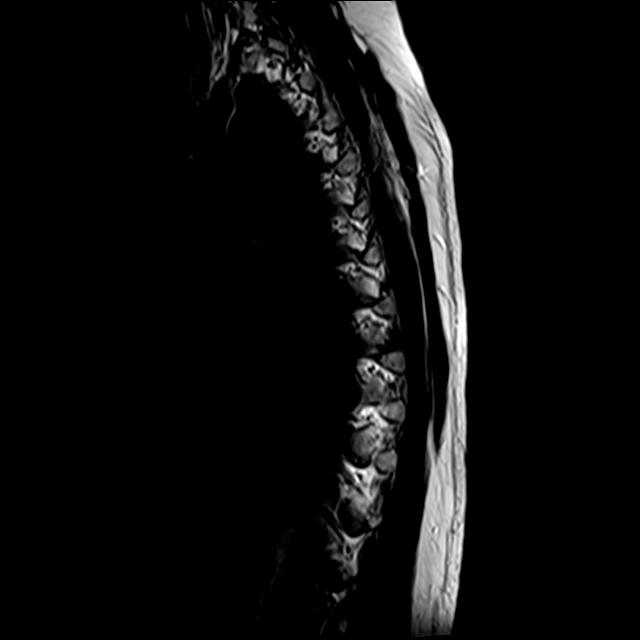

[Series 7: T2 · axial · 4.0mm · 0.52mm/px · z∈[-279,-37]mm · 9 of 40 slices shown (1 of 2)]
[im 1/40]
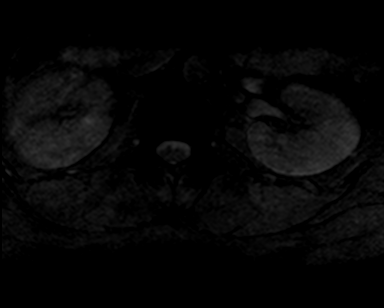
[im 5/40]
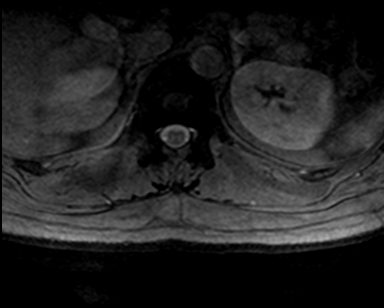
[im 10/40]
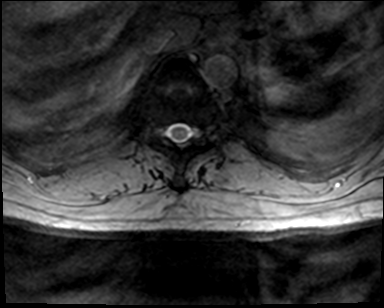
[im 15/40]
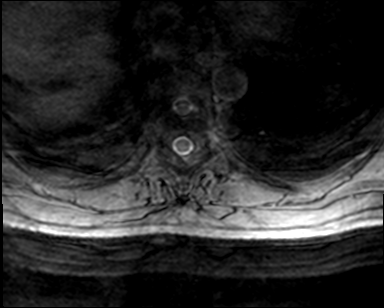
[im 20/40]
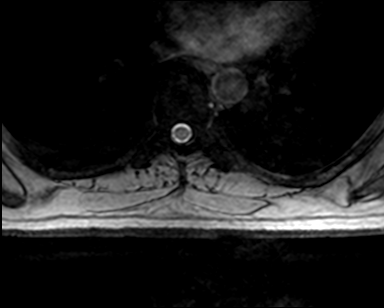
[im 25/40]
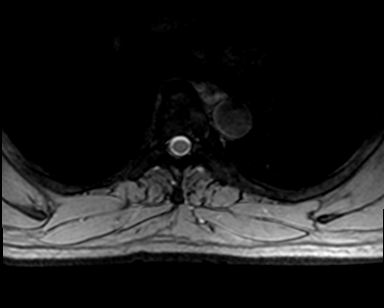
[im 30/40]
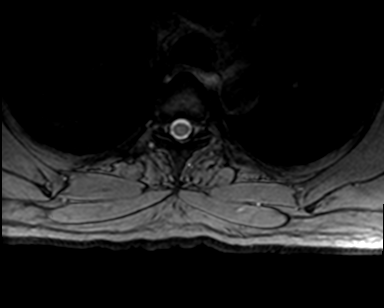
[im 35/40]
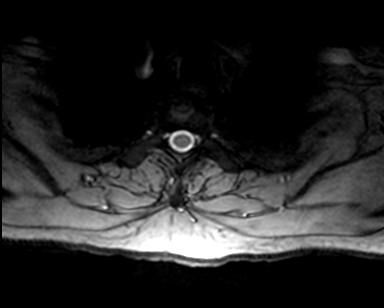
[im 40/40]
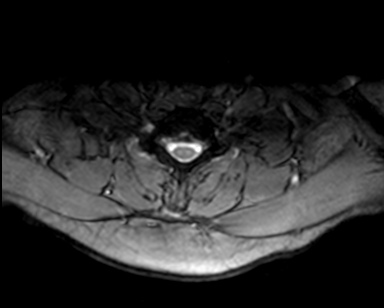

[Series 8: T2 · axial · 4.0mm · 0.39mm/px · z∈[-279,-37]mm · 8 of 37 slices shown (2 of 2)]
[im 1/37]
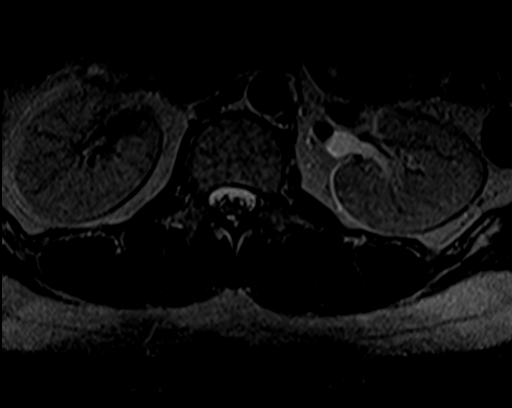
[im 6/37]
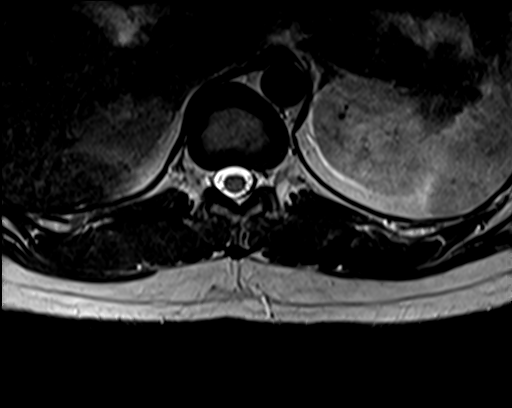
[im 11/37]
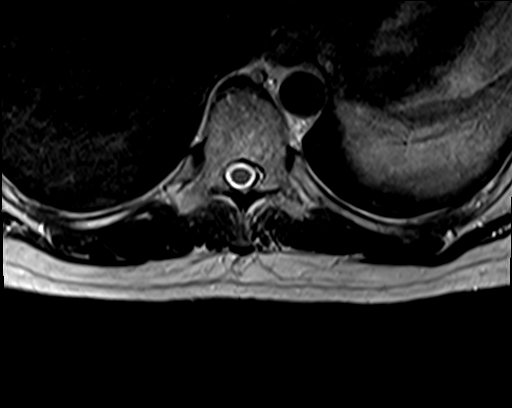
[im 16/37]
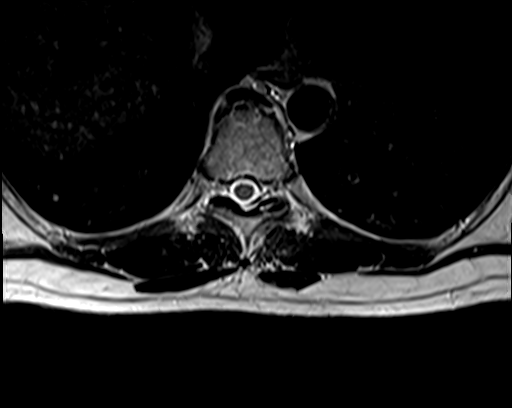
[im 21/37]
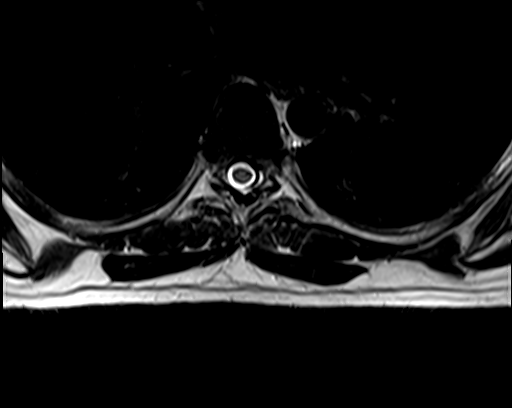
[im 26/37]
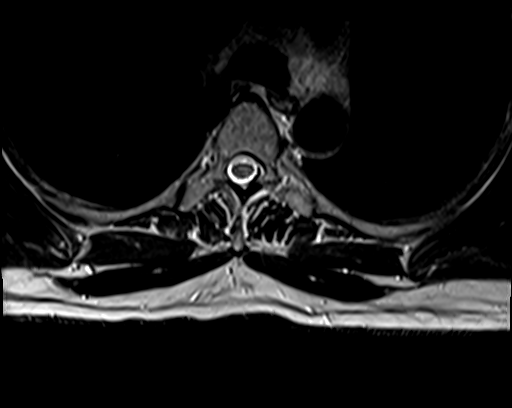
[im 31/37]
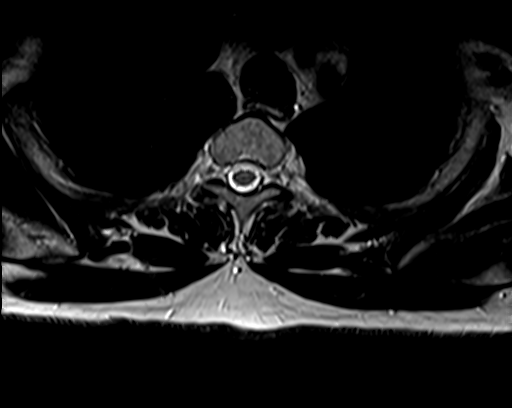
[im 37/37]
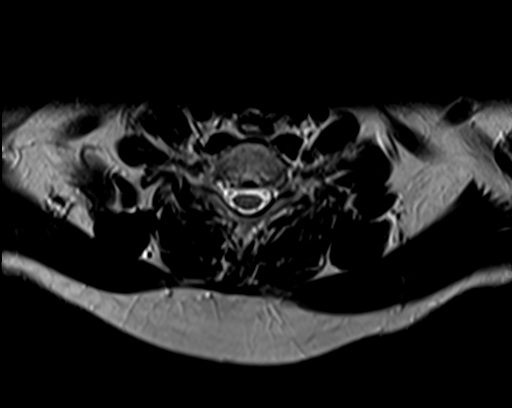

[Series 9: T1 · axial · 4.0mm · 0.39mm/px · z∈[-279,-210]mm · 3 of 40 slices shown (3 of 3)]
[im 1/40]
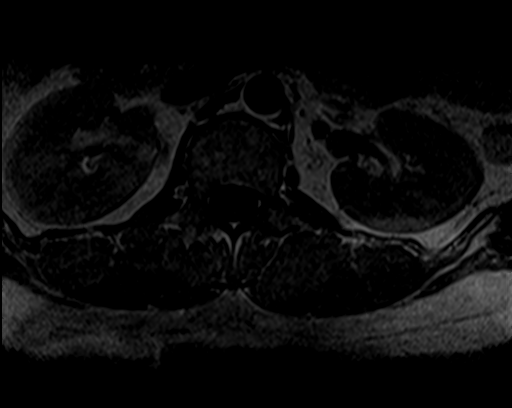
[im 5/40]
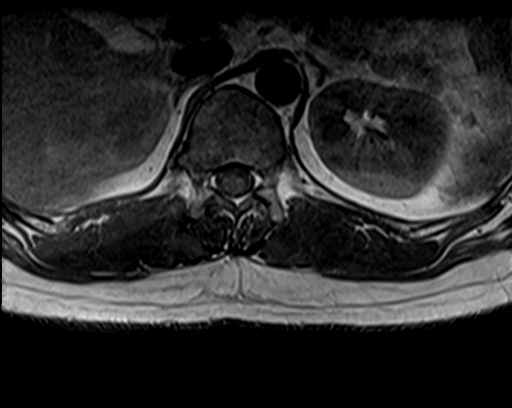
[im 10/40]
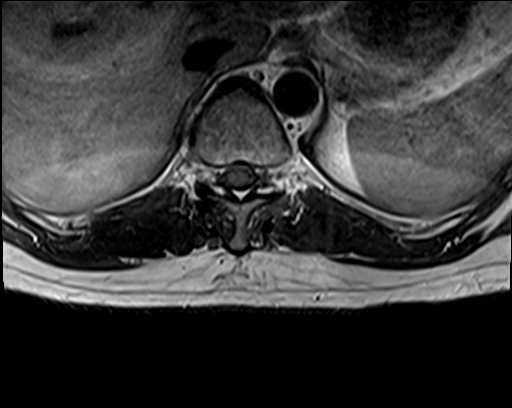

[25 of 48 positions shown; findings below may reference images not displayed]

FINDINGS: Limited cervical spine imaging: Some lower cervical disc bulging and
disc space loss at C5-C6 and C6-C7. Suggestion of mild spinal
stenosis at the latter.

Thoracic spine segmentation:  Appears to be normal.

Alignment: Preserved thoracic kyphosis. No spondylolisthesis. No
scoliosis.

Vertebrae: No marrow edema or evidence of acute osseous abnormality.
Visualized bone marrow signal is within normal limits.

Cord: Normal. No abnormal spinal cord signal. No abnormal intradural
enhancement. No dural thickening. The conus medullaris occurs below
T12-L1.

Paraspinal and other soft tissues: Negative.

Disc levels:

T1-T2: Negative.

T2-T3: Negative.

T3-T4: Negative.

T4-T5: Anterior disc bulging and endplate spurring but otherwise
negative.

T5-T6: Negative.

T6-T7: Negative.

T7-T8: Subtle right paracentral disc bulge or protrusion. No
stenosis, and otherwise negative.

T8-T9: Subtle left paracentral disc bulging. No stenosis and
otherwise negative.

T9-T10: Mild mostly anterior circumferential disc bulge. No
stenosis.

T10-T11: Mild facet hypertrophy greater on the left. No stenosis and
otherwise negative.

T11-T12: Negative.

T12-L1: Negative.
IMPRESSION: 1. Largely normal MRI appearance of the thoracic spine. Mild disc
and minimal posterior element degeneration. No spinal stenosis or
neural impingement.
2. Lower cervical spine degeneration with possible mild spinal
stenosis at C6-C7.

## 2021-10-25 MED ORDER — GADOBENATE DIMEGLUMINE 529 MG/ML IV SOLN
12.0000 mL | Freq: Once | INTRAVENOUS | Status: AC | PRN
  Administered 2021-10-25: 12 mL via INTRAVENOUS

## 2021-10-28 DIAGNOSIS — M545 Low back pain, unspecified: Secondary | ICD-10-CM | POA: Insufficient documentation

## 2021-10-28 DIAGNOSIS — M546 Pain in thoracic spine: Secondary | ICD-10-CM | POA: Insufficient documentation

## 2022-03-06 ENCOUNTER — Other Ambulatory Visit: Payer: Self-pay | Admitting: Gastroenterology

## 2022-03-06 DIAGNOSIS — R9389 Abnormal findings on diagnostic imaging of other specified body structures: Secondary | ICD-10-CM

## 2022-03-06 DIAGNOSIS — K859 Acute pancreatitis without necrosis or infection, unspecified: Secondary | ICD-10-CM

## 2022-04-01 ENCOUNTER — Ambulatory Visit
Admission: RE | Admit: 2022-04-01 | Discharge: 2022-04-01 | Disposition: A | Discharge status: Home / Self Care | Payer: 59 | Source: Ambulatory Visit | Attending: Gastroenterology | Admitting: Gastroenterology

## 2022-04-01 DIAGNOSIS — K859 Acute pancreatitis without necrosis or infection, unspecified: Secondary | ICD-10-CM

## 2022-04-01 DIAGNOSIS — R9389 Abnormal findings on diagnostic imaging of other specified body structures: Secondary | ICD-10-CM

## 2022-04-01 IMAGING — US US ABDOMEN LIMITED
1 series · 14 of 25 positions shown · non-contrast
Comparison: Abdominal ultrasound [DATE]

CLINICAL DATA: Pancreatitis

EXAM:
ULTRASOUND ABDOMEN LIMITED RIGHT UPPER QUADRANT

[Series 1: us abdomen limited · 0.19mm/px · 14 of 54 slices shown]
[im 1/54]
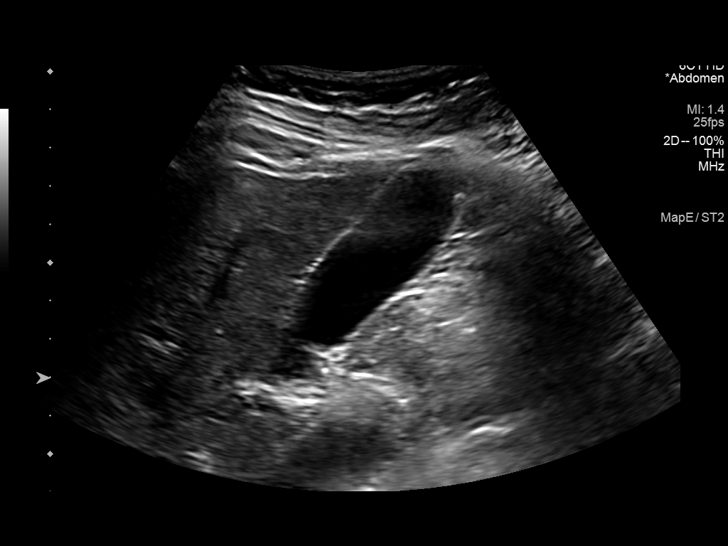
[im 5/54]
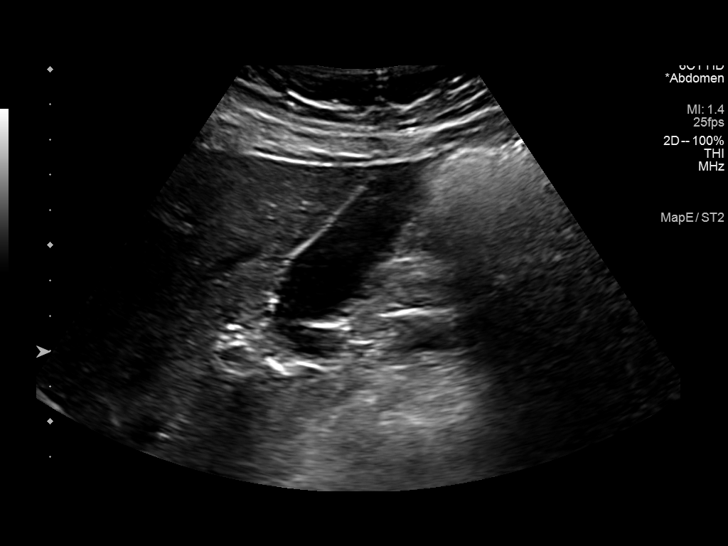
[im 9/54]
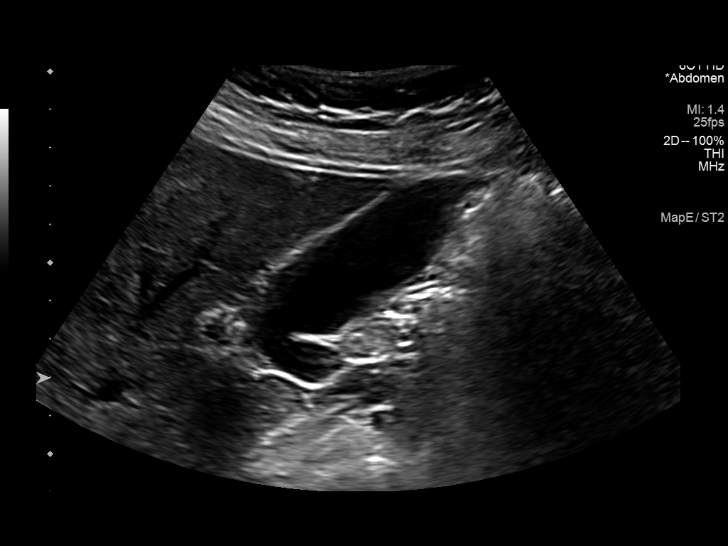
[im 14/54]
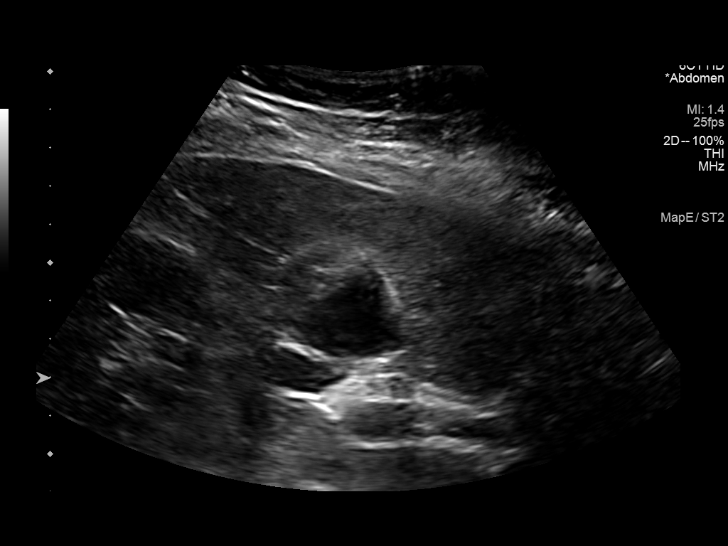
[im 18/54]
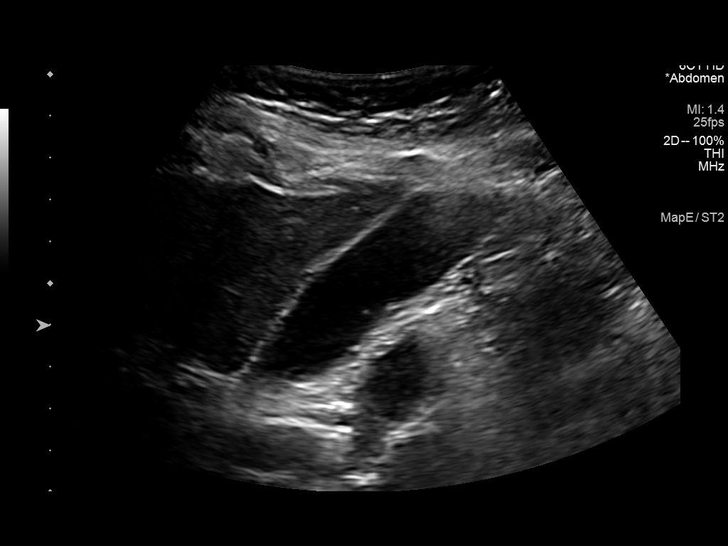
[im 20/54]
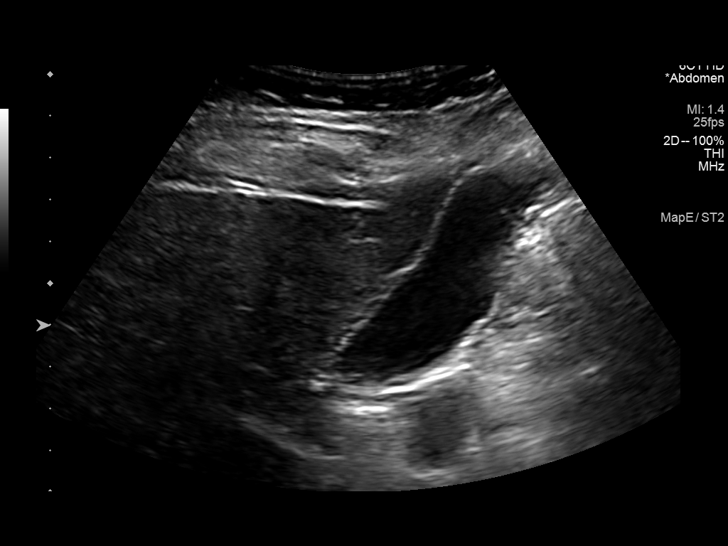
[im 25/54]
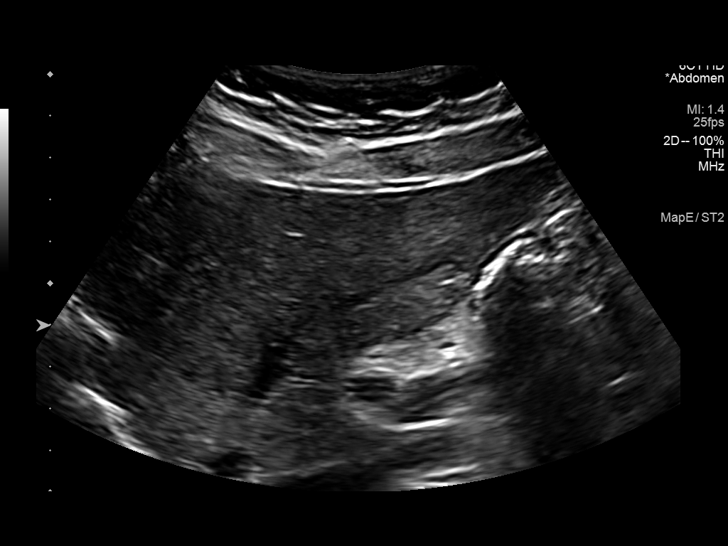
[im 29/54]
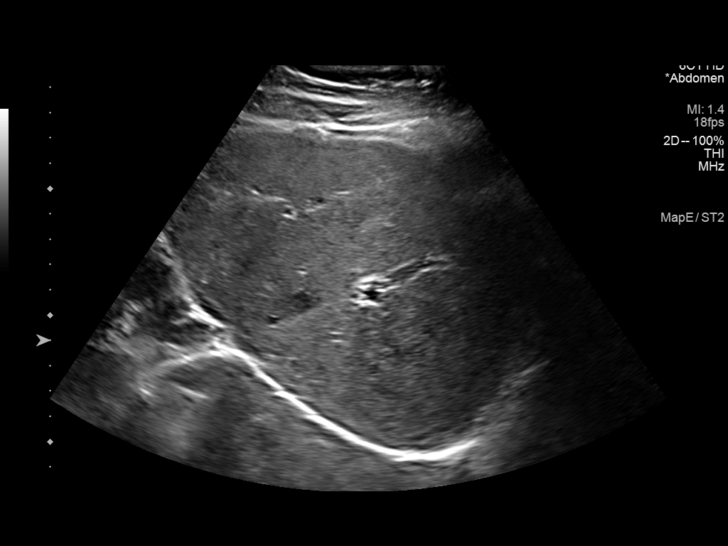
[im 34/54]
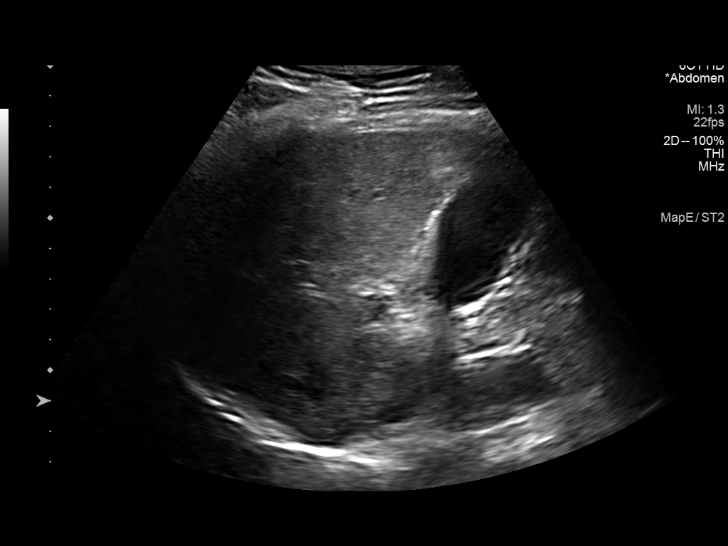
[im 36/54]
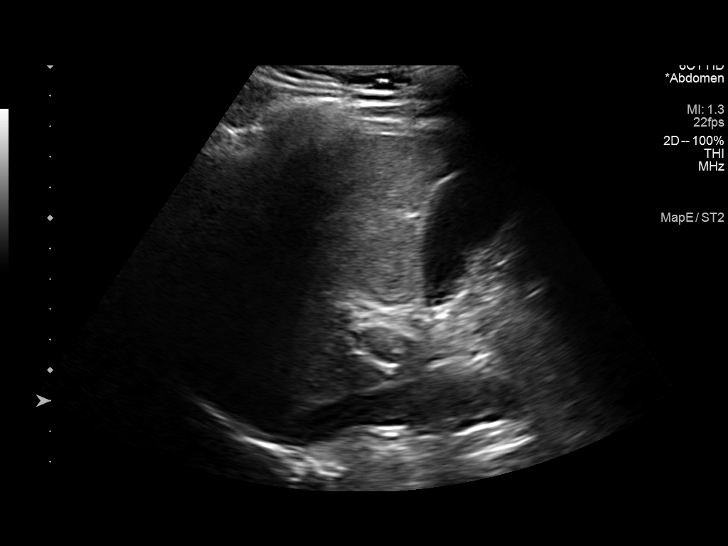
[im 40/54]
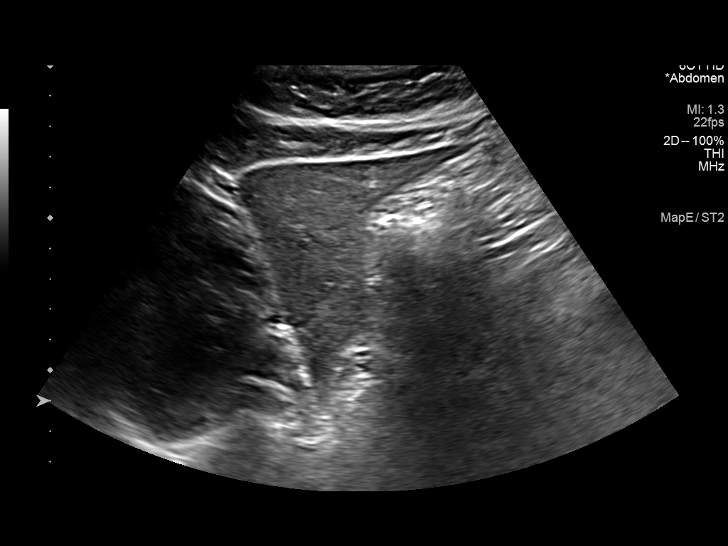
[im 45/54]
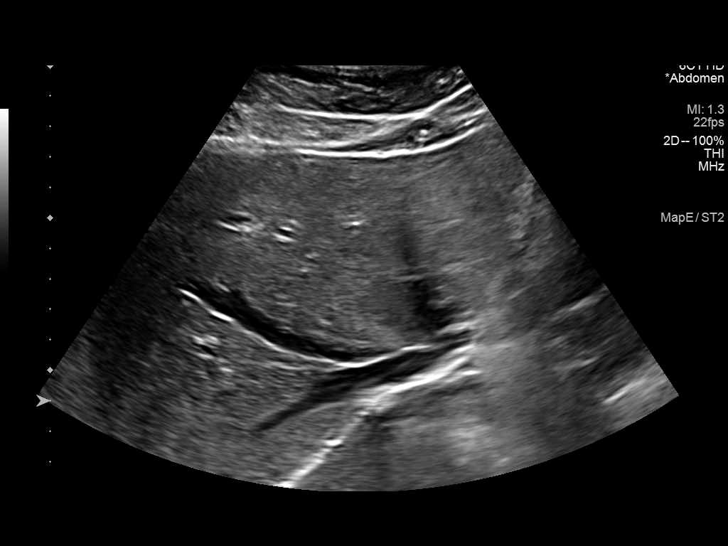
[im 49/54]
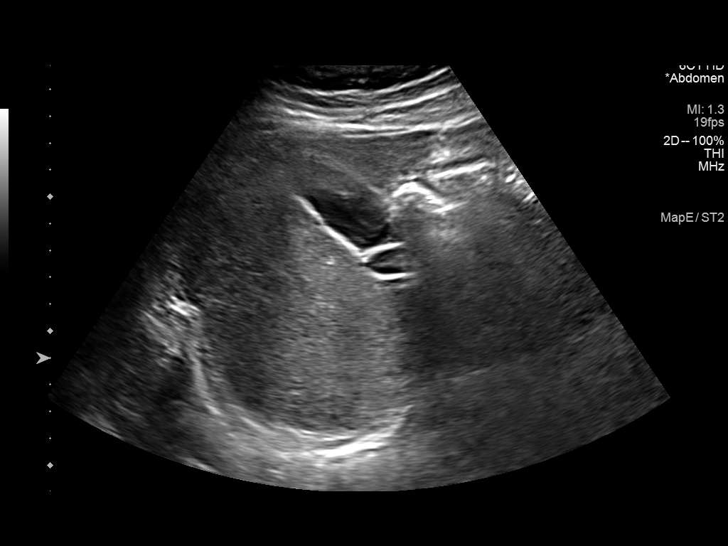
[im 54/54]
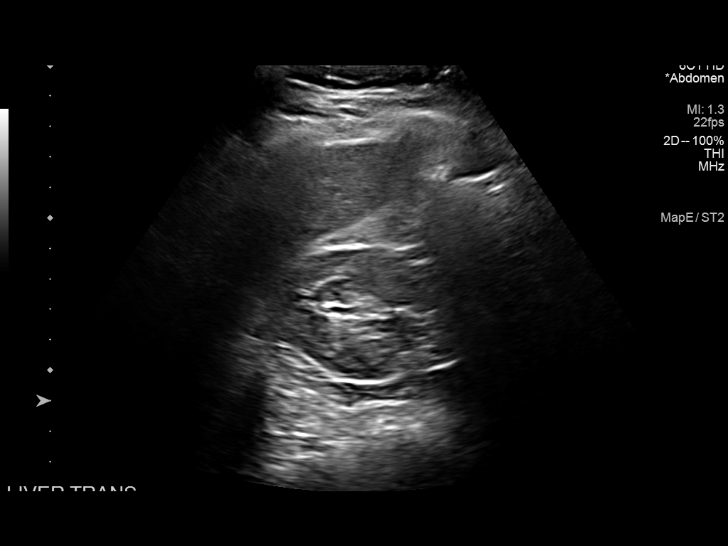

[14 of 25 positions shown; findings below may reference images not displayed]

FINDINGS: Gallbladder:

No gallstones or wall thickening visualized. No sonographic Murphy
sign noted by sonographer.

Common bile duct:

Diameter: 2 mm

Liver:

No focal lesion identified. Within normal limits in parenchymal
echogenicity. Portal vein is patent on color Doppler imaging with
normal direction of blood flow towards the liver.

Other: None.
IMPRESSION: Unremarkable examination.

## 2022-04-01 IMAGING — MR MR ABDOMEN WO/W CM MRCP
18 of 19 series · 43 of 48 positions shown · IV contrast (multihance)
Comparison: None Available.

CLINICAL DATA: Abdominal pain and unintentional 10 lb weight loss
during past month. Abnormal liver function tests. Pancreatitis.

EXAM:
MRI ABDOMEN WITHOUT AND WITH CONTRAST (INCLUDING MRCP)
TECHNIQUE: Multiplanar multisequence MR imaging of the abdomen was performed
both before and after the administration of intravenous contrast.
Heavily T2-weighted images of the biliary and pancreatic ducts were
obtained, and three-dimensional MRCP images were rendered by post
processing.
CONTRAST:  14mL MULTIHANCE GADOBENATE DIMEGLUMINE 529 MG/ML IV SOLN

[Series 4: T2 · coronal · 5.0mm · 1.56mm/px · 2 of 36 slices shown (1 of 4)]
[im 1/36]
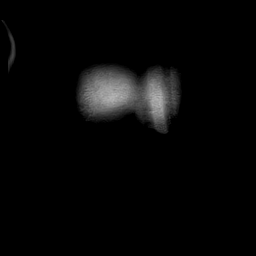
[im 36/36]
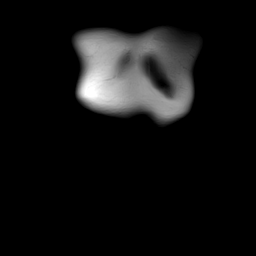

[Series 5: T1 · axial · 3.0mm · 1.19mm/px · z∈[-71,+142]mm · 5 of 144 slices shown]
[im 1/144]
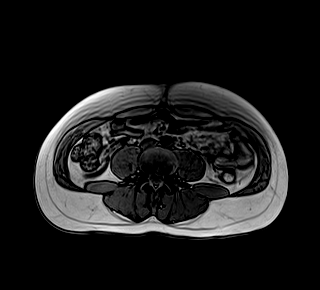
[im 36/144]
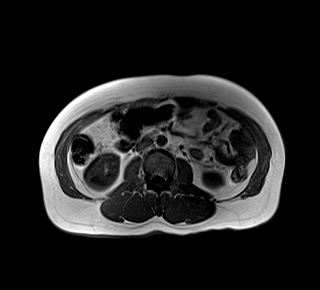
[im 72/144]
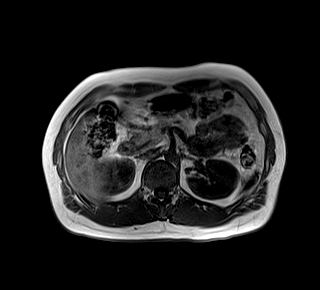
[im 108/144]
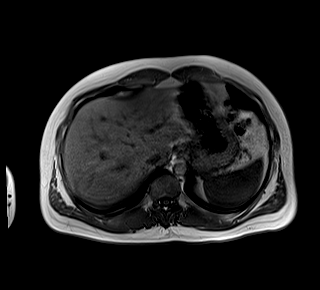
[im 144/144]
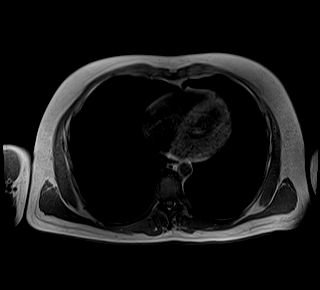

[Series 6: T2 · axial · 5.0mm · 1.48mm/px · 1 of 36 slices shown (2 of 4)]
[im 1/36]
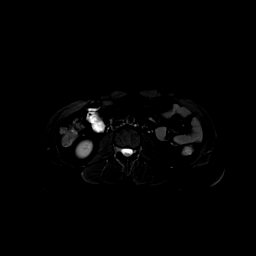

[Series 7: DWI · axial · 5.0mm · 1.42mm/px · z∈[-48,+162]mm · 4 of 108 slices shown (1 of 2)]
[im 1/108]
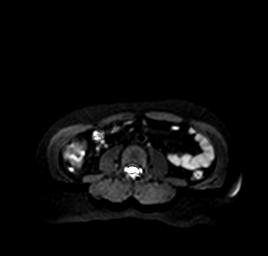
[im 36/108]
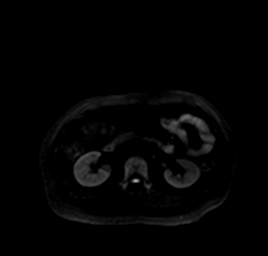
[im 72/108]
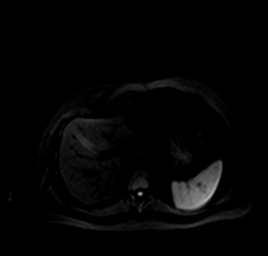
[im 108/108]
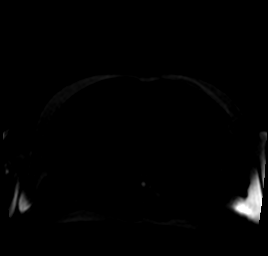

[Series 8: DWI · axial · 5.0mm · 1.42mm/px · 1 of 36 slices shown (2 of 2)]
[im 1/36]
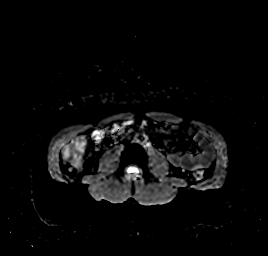

[Series 9: T2 · axial · 6.0mm · 1.22mm/px · 1 of 32 slices shown (3 of 4)]
[im 1/32]
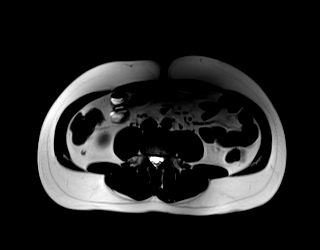

[Series 10: bSSFP · axial · 5.0mm · 1.25mm/px · 1 of 40 slices shown]
[im 1/40]
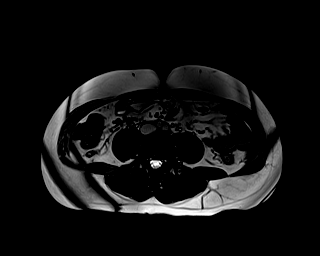

[Series 13: T2 · coronal · 3.0mm · 1.19mm/px · 1 of 19 slices shown (4 of 4)]
[im 1/19]
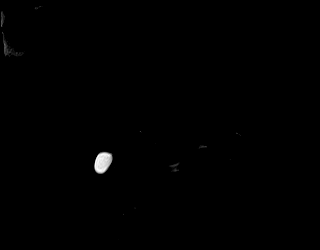

[Series 14: MRCP · coronal · 1.0mm · 0.49mm/px · 2 of 64 slices shown]
[im 1/64]
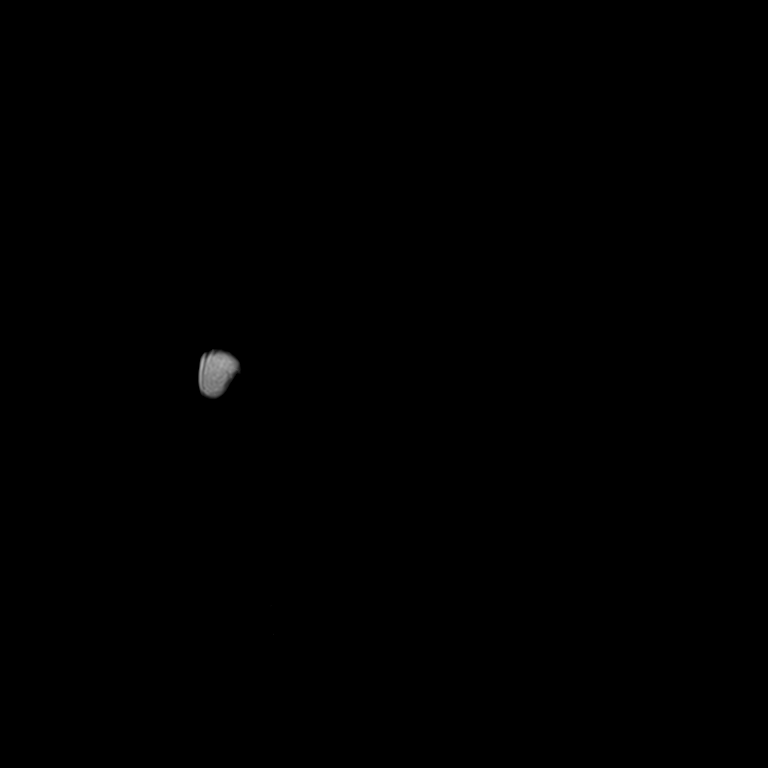
[im 64/64]
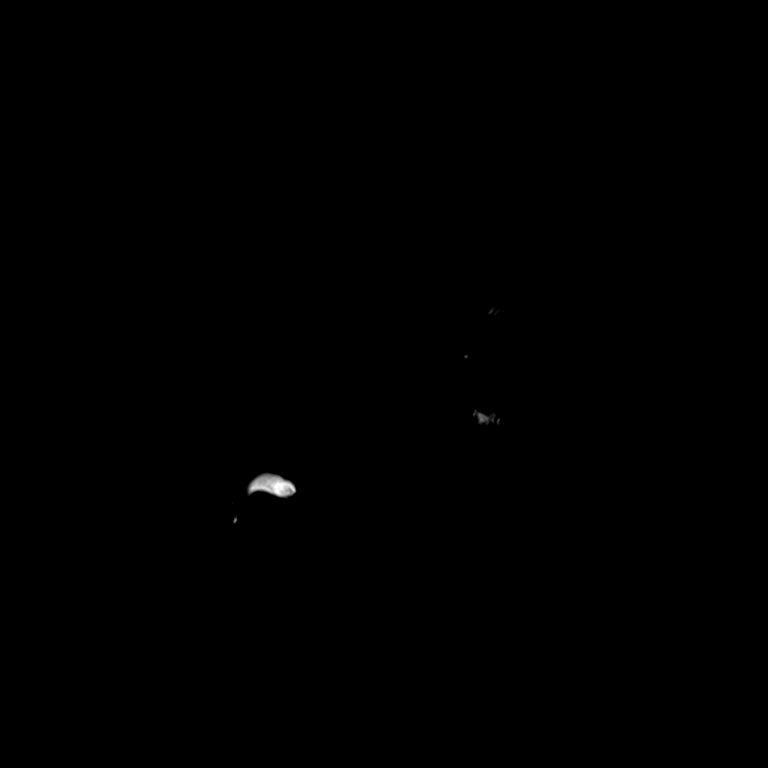

[Series 16: T1 dynamic · axial · non-contrast · 3.0mm · 1.25mm/px · z∈[-74,+163]mm · 3 of 80 slices shown]
[im 1/80]
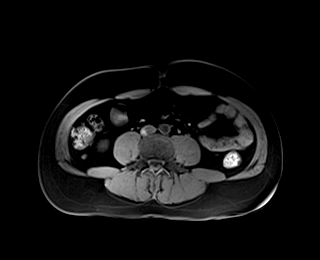
[im 40/80]
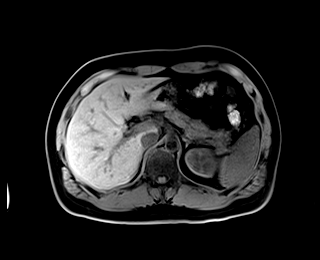
[im 80/80]
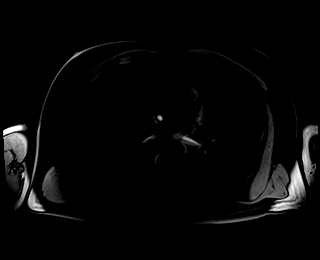

[Series 17: T1 dynamic post-contrast · axial · 3.0mm · 1.25mm/px · z∈[-74,+163]mm · 3 of 80 slices shown (1 of 8)]
[im 1/80]
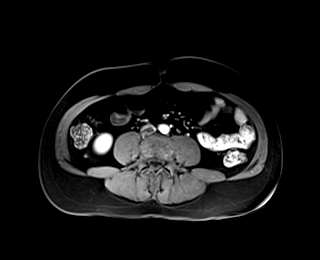
[im 40/80]
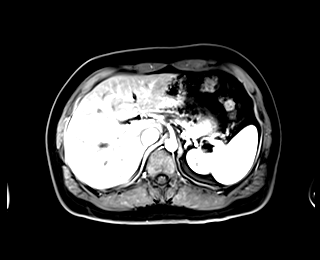
[im 80/80]
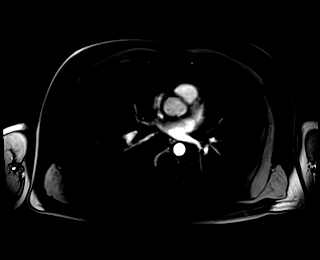

[Series 18: T1 dynamic post-contrast · axial · 3.0mm · 1.25mm/px · z∈[-74,+163]mm · 3 of 80 slices shown (2 of 8)]
[im 1/80]
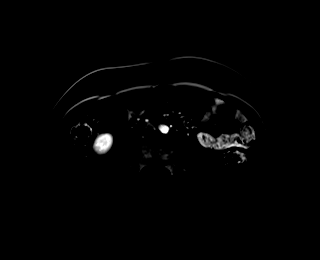
[im 40/80]
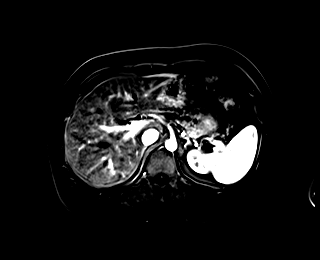
[im 80/80]
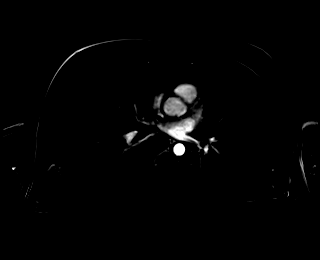

[Series 19: T1 dynamic post-contrast · axial · 3.0mm · 1.25mm/px · z∈[-74,+163]mm · 3 of 80 slices shown (3 of 8)]
[im 1/80]
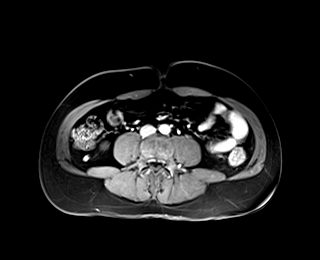
[im 40/80]
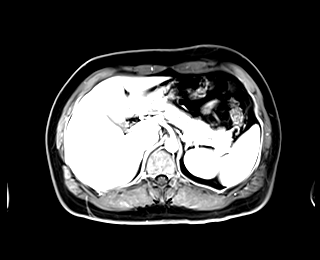
[im 80/80]
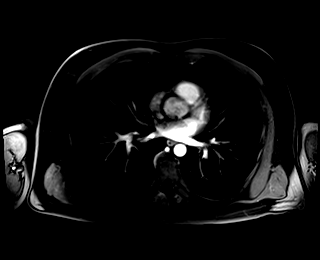

[Series 20: T1 dynamic post-contrast · axial · 3.0mm · 1.25mm/px · z∈[-74,+163]mm · 3 of 80 slices shown (4 of 8)]
[im 1/80]
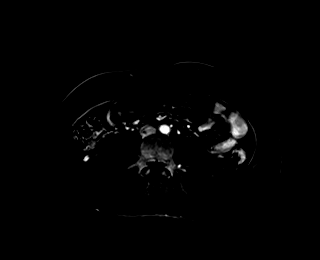
[im 40/80]
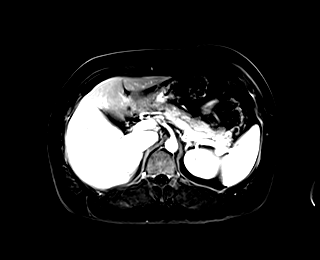
[im 80/80]
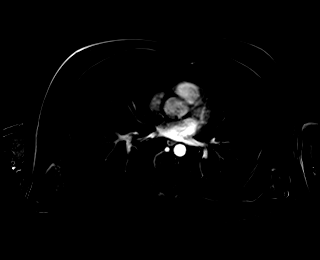

[Series 21: T1 dynamic post-contrast · axial · 3.0mm · 1.25mm/px · z∈[-74,+163]mm · 3 of 80 slices shown (5 of 8)]
[im 1/80]
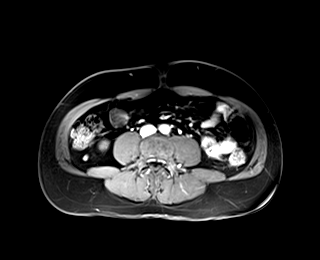
[im 40/80]
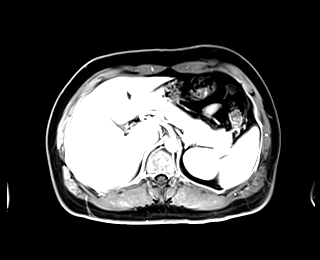
[im 80/80]
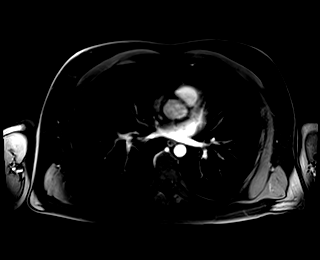

[Series 22: T1 dynamic post-contrast · axial · 3.0mm · 1.25mm/px · z∈[-74,+163]mm · 3 of 80 slices shown (6 of 8)]
[im 1/80]
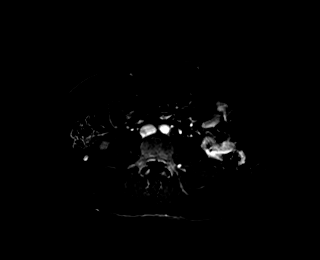
[im 40/80]
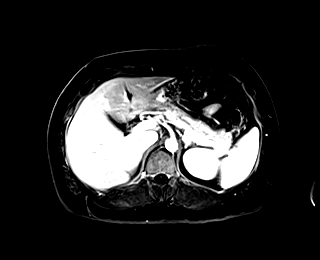
[im 80/80]
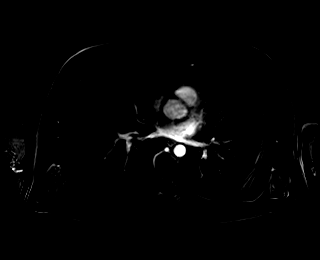

[Series 23: T1 dynamic post-contrast · coronal · 3.0mm · 1.25mm/px · 3 of 80 slices shown (7 of 8)]
[im 1/80]
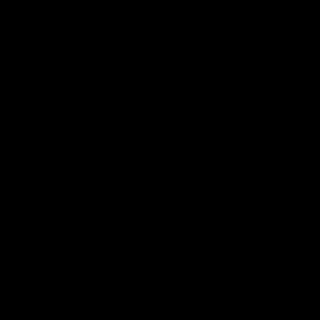
[im 40/80]
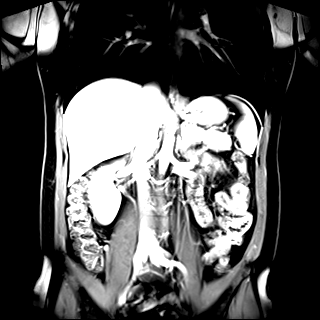
[im 80/80]
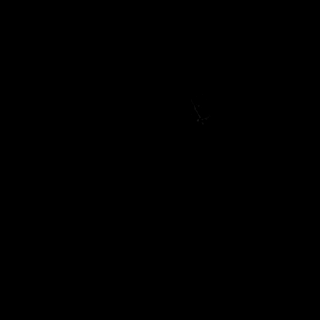

[Series 24: T1 dynamic post-contrast · axial · 3.0mm · 1.25mm/px · 1 of 80 slices shown (8 of 8)]
[im 1/80]
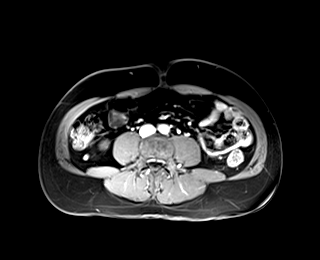

[43 of 48 positions shown; findings below may reference images not displayed]

FINDINGS: Lower chest: No acute findings.

Hepatobiliary: No hepatic masses identified. Gallbladder is
unremarkable. No evidence of biliary ductal dilatation.

Pancreas: A solid mass is seen in the pancreatic body measuring
x 2.2 cm on image [DATE]. This obstructs the pancreatic duct with mild
ductal dilatation in the pancreatic tail. This encases the proximal
splenic artery, also appears to involve the distal splenic vein, but
there is no evidence of involvement of the celiac or superior
mesenteric arteries. This is consistent with pancreatic carcinoma.

Spleen:  Within normal limits in size and appearance.

Adrenals/Urinary Tract: No masses identified. No evidence of
hydronephrosis.

Stomach/Bowel: Unremarkable.

Vascular/Lymphatic: No pathologically enlarged lymph nodes
identified. No acute vascular findings.

Other:  None.

Musculoskeletal:  No suspicious bone lesions identified.
IMPRESSION: 2.8 cm solid mass in the pancreatic body, consistent with pancreatic
carcinoma. This mass encases the proximal splenic artery and
involves the distal splenic vein.

No evidence of abdominal metastatic disease.

These results will be called to the ordering clinician or
representative by the Radiologist Assistant, and communication
documented in the PACS or [REDACTED].

## 2022-04-01 MED ORDER — GADOBENATE DIMEGLUMINE 529 MG/ML IV SOLN
14.0000 mL | Freq: Once | INTRAVENOUS | Status: AC | PRN
  Administered 2022-04-01: 14 mL via INTRAVENOUS

## 2022-04-02 ENCOUNTER — Other Ambulatory Visit: Payer: Self-pay | Admitting: Gastroenterology

## 2022-04-03 ENCOUNTER — Encounter (HOSPITAL_COMMUNITY): Payer: Self-pay | Admitting: Gastroenterology

## 2022-04-03 ENCOUNTER — Ambulatory Visit (HOSPITAL_COMMUNITY)
Admission: RE | Admit: 2022-04-03 | Discharge: 2022-04-03 | Disposition: A | Discharge status: Home / Self Care | Payer: 59 | Source: Ambulatory Visit | Attending: Gastroenterology | Admitting: Gastroenterology

## 2022-04-03 ENCOUNTER — Ambulatory Visit (HOSPITAL_COMMUNITY): Payer: 59 | Admitting: Anesthesiology

## 2022-04-03 ENCOUNTER — Other Ambulatory Visit: Payer: Self-pay

## 2022-04-03 ENCOUNTER — Ambulatory Visit (HOSPITAL_BASED_OUTPATIENT_CLINIC_OR_DEPARTMENT_OTHER): Payer: 59 | Admitting: Anesthesiology

## 2022-04-03 ENCOUNTER — Encounter (HOSPITAL_COMMUNITY)
Admission: RE | Disposition: A | Discharge status: Home / Self Care | Payer: Self-pay | Source: Ambulatory Visit | Attending: Gastroenterology

## 2022-04-03 DIAGNOSIS — F17210 Nicotine dependence, cigarettes, uncomplicated: Secondary | ICD-10-CM

## 2022-04-03 DIAGNOSIS — K859 Acute pancreatitis without necrosis or infection, unspecified: Secondary | ICD-10-CM | POA: Diagnosis not present

## 2022-04-03 DIAGNOSIS — I10 Essential (primary) hypertension: Secondary | ICD-10-CM

## 2022-04-03 DIAGNOSIS — F17200 Nicotine dependence, unspecified, uncomplicated: Secondary | ICD-10-CM | POA: Insufficient documentation

## 2022-04-03 DIAGNOSIS — R933 Abnormal findings on diagnostic imaging of other parts of digestive tract: Secondary | ICD-10-CM | POA: Diagnosis present

## 2022-04-03 HISTORY — PX: UPPER ESOPHAGEAL ENDOSCOPIC ULTRASOUND (EUS): SHX6562

## 2022-04-03 HISTORY — PX: FINE NEEDLE ASPIRATION: SHX5430

## 2022-04-03 HISTORY — PX: ESOPHAGOGASTRODUODENOSCOPY (EGD) WITH PROPOFOL: SHX5813

## 2022-04-03 SURGERY — UPPER ESOPHAGEAL ENDOSCOPIC ULTRASOUND (EUS)
Anesthesia: Monitor Anesthesia Care

## 2022-04-03 MED ORDER — LACTATED RINGERS IV SOLN
INTRAVENOUS | Status: AC | PRN
  Administered 2022-04-03: 10 mL/h via INTRAVENOUS

## 2022-04-03 MED ORDER — PROPOFOL 10 MG/ML IV BOLUS
INTRAVENOUS | Status: DC | PRN
  Administered 2022-04-03: 40 mg via INTRAVENOUS
  Administered 2022-04-03: 30 mg via INTRAVENOUS
  Administered 2022-04-03: 20 mg via INTRAVENOUS

## 2022-04-03 MED ORDER — LIDOCAINE 2% (20 MG/ML) 5 ML SYRINGE
INTRAMUSCULAR | Status: DC | PRN
  Administered 2022-04-03: 100 mg via INTRAVENOUS

## 2022-04-03 MED ORDER — SODIUM CHLORIDE 0.9 % IV SOLN: INTRAVENOUS | Status: DC

## 2022-04-03 MED ORDER — PROPOFOL 500 MG/50ML IV EMUL
INTRAVENOUS | Status: DC | PRN
  Administered 2022-04-03: 150 ug/kg/min via INTRAVENOUS

## 2022-04-03 NOTE — Anesthesia Preprocedure Evaluation (Addendum)
Anesthesia Evaluation  Patient identified by MRN, date of birth, ID band Patient awake    Reviewed: Allergy & Precautions, NPO status , Patient's Chart, lab work & pertinent test results  Airway Mallampati: II  TM Distance: >3 FB Neck ROM: Full    Dental  (+) Teeth Intact, Dental Advisory Given   Pulmonary Current Smoker,    breath sounds clear to auscultation       Cardiovascular hypertension, Pt. on medications  Rhythm:Regular Rate:Normal     Neuro/Psych negative neurological ROS  negative psych ROS   GI/Hepatic Neg liver ROS, GERD  Medicated,  Endo/Other  negative endocrine ROS  Renal/GU negative Renal ROS     Musculoskeletal negative musculoskeletal ROS (+)   Abdominal Normal abdominal exam  (+)   Peds  Hematology negative hematology ROS (+)   Anesthesia Other Findings   Reproductive/Obstetrics                            Anesthesia Physical Anesthesia Plan  ASA: 2  Anesthesia Plan: MAC   Post-op Pain Management:    Induction: Intravenous  PONV Risk Score and Plan: 0 and Propofol infusion  Airway Management Planned: Natural Airway and Simple Face Mask  Additional Equipment: None  Intra-op Plan:   Post-operative Plan:   Informed Consent: I have reviewed the patients History and Physical, chart, labs and discussed the procedure including the risks, benefits and alternatives for the proposed anesthesia with the patient or authorized representative who has indicated his/her understanding and acceptance.       Plan Discussed with: CRNA  Anesthesia Plan Comments:        Anesthesia Quick Evaluation

## 2022-04-03 NOTE — Anesthesia Postprocedure Evaluation (Signed)
Anesthesia Post Note  Patient: Curtis Stephens  Procedure(s) Performed: UPPER ESOPHAGEAL ENDOSCOPIC ULTRASOUND (EUS) FINE NEEDLE ASPIRATION (FNA) LINEAR     Patient location during evaluation: PACU Anesthesia Type: MAC Level of consciousness: awake and alert Pain management: pain level controlled Vital Signs Assessment: post-procedure vital signs reviewed and stable Respiratory status: spontaneous breathing, nonlabored ventilation, respiratory function stable and patient connected to nasal cannula oxygen Cardiovascular status: stable and blood pressure returned to baseline Postop Assessment: no apparent nausea or vomiting Anesthetic complications: no   No notable events documented.  Last Vitals:  Vitals:   04/03/22 1354 04/03/22 1400  BP: (!) 118/51 123/72  Pulse: 99 82  Resp: 12 13  Temp: (!) 36.1 C   SpO2: 100% 100%    Last Pain:  Vitals:   04/03/22 1400  TempSrc:   PainSc: 0-No pain                 Effie Berkshire

## 2022-04-03 NOTE — Discharge Instructions (Signed)

## 2022-04-03 NOTE — Op Note (Signed)
[facility] Patient Name: Curtis Stephens Procedure Date: 04/03/2022 MRN: [id number] Attending MD: Carol Ada , MD Date of Birth: 07/16/1986 CSN: [id number] Age: 36 Admit Type: Outpatient Procedure:                Upper EUS Indications:              Suspected mass in pancreas on MRI Providers:                Carol Ada, MD, Benay Pillow, RN, Despina Pole, Technician Referring MD:              Medicines:                Propofol per Anesthesia Complications:            No immediate complications. Estimated Blood Loss:     Estimated blood loss: none. Procedure:                Pre-Anesthesia Assessment:                           - Prior to the procedure, a History and Physical                            was performed, and patient medications and                            allergies were reviewed. The patient's tolerance of                            previous anesthesia was also reviewed. The risks                            and benefits of the procedure and the sedation                            options and risks were discussed with the patient.                            All questions were answered, and informed consent                            was obtained. Prior Anticoagulants: The patient has                            taken no previous anticoagulant or antiplatelet                            agents. After reviewing the risks and benefits, the                            patient was deemed in satisfactory condition to  undergo the procedure.                           - Sedation was administered by an anesthesia                            professional. Deep sedation was attained.                           After obtaining informed consent, the endoscope was                            passed under direct vision. Throughout the                            procedure, the patient's blood pressure, pulse, and                             oxygen saturations were monitored continuously. The                            [id number] ([id number] Olympus linear ultrasound scope                            was introduced through the mouth, and advanced to                            the second part of duodenum. The upper EUS was                            accomplished without difficulty. The patient                            tolerated the procedure well. Scope In: Scope Out: Findings:      ENDOSONOGRAPHIC FINDING: :      Endosonographic imaging of the pancreas showed sonographic changes       indicative of moderate-severe chronic pancreatitis in the entire       pancreas. The parenchyma had lobularity. The pancreatic duct had duct       dilation. The pancreatic duct measured up to 2.6 mm in diameter. Fine       needle aspiration for cytology was performed. Color Doppler imaging was       utilized prior to needle puncture to confirm a lack of significant       vascular structures within the needle path. One pass was made with the       25 gauge needle using a transgastric approach. A stylet was used. A       cytotechnologist was present to evaluate the adequacy of the specimen.       Cytology was preliminarily non-diagnostic.      In the body and tail of the pancreas there was evidence of significant       lobularity. Initially there was the possibility of a mass in the body of       the pancreas, consistent with the MRI findings. One pass with the FNA 25  gauge needle was performed and it was difficult to pass into the       suspected area. The presumed lesion was dense and the needle deflected       several times. A pass was made into the lesion and no samples were       obtained. With a relook at the area it did not necessarily appear to be       discrete mass. In fact, the body and tail region were amorphus and       lobular, which can be consistent with chronic pancreatitis. The       lobularity was not as  prominent in the head of the pancreas. The PD was       moderately dilated in the body of the pancreas. Impression:               - Endosonographic imaging of the pancreas showed                            sonographic changes suggestive of moderate-severe                            chronic pancreatitis. Fine needle aspiration                            performed. Moderate Sedation:      Not Applicable - Patient had care per Anesthesia. Recommendation:           - Patient has a contact number available for                            emergencies. The signs and symptoms of potential                            delayed complications were discussed with the                            patient. Return to normal activities tomorrow.                            Written discharge instructions were provided to the                            patient.                           - Resume regular diet.                           - Check IgG4.                           - Follow up with Dr. Georgiann Cocker.                           - Repeat imaging in 1-2 months with an MRI versus  EUS. Procedure Code(s):        --- Professional ---                           [phone], Esophagogastroduodenoscopy, flexible,                            transoral; with transendoscopic ultrasound-guided                            intramural or transmural fine needle                            aspiration/biopsy(s), (includes endoscopic                            ultrasound examination limited to the esophagus,                            stomach or duodenum, and adjacent structures) Diagnosis Code(s):        --- Professional ---                           R93.3, Abnormal findings on diagnostic imaging of                            other parts of digestive tract CPT copyright 2019 [employer]. All rights reserved. The codes documented in this report are preliminary and upon coder review may  be  revised to meet current compliance requirements. Carol Ada, MD Carol Ada, MD 04/03/2022 2:05:44 PM This report has been signed electronically. Number of Addenda: 0

## 2022-04-03 NOTE — Transfer of Care (Signed)
Immediate Anesthesia Transfer of Care Note  Patient: Curtis Stephens  Procedure(s) Performed: UPPER ESOPHAGEAL ENDOSCOPIC ULTRASOUND (EUS) FINE NEEDLE ASPIRATION (FNA) LINEAR  Patient Location: Endoscopy Unit  Anesthesia Type:MAC  Level of Consciousness: awake, alert  and oriented  Airway & Oxygen Therapy: Patient Spontanous Breathing and Patient connected to face mask oxygen  Post-op Assessment: Report given to RN and Post -op Vital signs reviewed and stable  Post vital signs: Reviewed and stable  Last Vitals:  Vitals Value Taken Time  BP 122/78   Temp    Pulse 95 04/03/22 1354  Resp 14 04/03/22 1354  SpO2 100 % 04/03/22 1354  Vitals shown include unvalidated device data.  Last Pain:  Vitals:   04/03/22 1133  TempSrc: Temporal  PainSc: 0-No pain         Complications: No notable events documented.

## 2022-04-03 NOTE — H&P (Signed)
Curtis Stephens HPI: This is a 36 year old male with a recent diagnosis of pancreatitis by Dr. Levora Angel.  Further work up with an MRI showed a 2.8 cm solid mass in the pancreatic body that encases the splenic artery.  An outside CT scan, per Dr. Georgiann Cocker was negative for any masses initially.  He is here today for an EUS with FNA.  Past Medical History:  Diagnosis Date   Elevated transaminase level    Hyperlipidemia    Hypertension    Rectal bleeding     Past Surgical History:  Procedure Laterality Date   TONSILLECTOMY      Family History  Problem Relation Age of Onset   Diabetes Father    Hypertension Father     Social History:  reports that he has been smoking. He has never used smokeless tobacco. He reports that he does not currently use alcohol. He reports that he does not use drugs.  Allergies:  Allergies  Allergen Reactions   Nsaids Other (See Comments)    Bleeding in stool   Crestor [Rosuvastatin Calcium] Other (See Comments)    Elevated liver functions   Meat Extract Other (See Comments)    Patient is vegetarian    Other Hives, Swelling and Rash    Iodine or orange/ family history    Medications: Scheduled: Continuous:  sodium chloride      No results found for this or any previous visit (from the past 24 hour(s)).   No results found.  ROS:  As stated above in the HPI otherwise negative.  There were no vitals taken for this visit.    PE: Gen: NAD, Alert and Oriented HEENT:  [place]/AT, EOMI Neck: Supple, no LAD Lungs: CTA Bilaterally CV: RRR without M/G/R ABD: Soft, NTND, +BS Ext: No C/C/E  Assessment/Plan: 1) Pancreatic mass - EUS with FNA.  Laster Appling D 04/03/2022, 11:20 AM

## 2022-04-06 ENCOUNTER — Telehealth: Payer: Self-pay

## 2022-04-06 ENCOUNTER — Other Ambulatory Visit: Payer: Self-pay | Admitting: Gastroenterology

## 2022-04-06 ENCOUNTER — Other Ambulatory Visit: Payer: Self-pay

## 2022-04-06 DIAGNOSIS — K861 Other chronic pancreatitis: Secondary | ICD-10-CM

## 2022-04-06 DIAGNOSIS — K8689 Other specified diseases of pancreas: Secondary | ICD-10-CM

## 2022-04-06 NOTE — Telephone Encounter (Signed)
EUS scheduled, pt instructed and medications reviewed.  Patient instructions mailed to home.  Patient to call with any questions or concerns.  

## 2022-04-06 NOTE — Telephone Encounter (Signed)
-----   Message from Lemar Lofty., MD sent at 04/03/2022  8:43 PM EDT ----- Regarding: Patient for EUS Curtis Stephens, This is a patient that Dr. Levora Angel and I have been discussing. Recent idiopathic pancreatitis and MRI imaging suggestive of ductal dilation though EUS by Dr. Elnoria Howard nondiagnostic other than concern for chronic pancreatitis. Dr. Levora Angel will be arranging a follow-up imaging study repeat MRI abdomen versus pancreas protocol CT abdomen in the next few weeks. July 17 I would like to add this patient on for repeat EUS FNB. Please look at the available time and make sure we push all patients to go back to back to back as I think there is sometime between the the currently scheduled fourth and fifth patient. Please update Dr. Levora Angel and I when the patient is scheduled. Thanks. GM

## 2022-04-06 NOTE — Telephone Encounter (Signed)
The pt has been scheduled for 05/04/22 at [facility] with GM at 130 pm  Left message on machine to call back

## 2022-04-07 ENCOUNTER — Encounter (HOSPITAL_COMMUNITY): Payer: Self-pay | Admitting: Gastroenterology

## 2022-04-17 ENCOUNTER — Other Ambulatory Visit: Payer: 59

## 2022-04-22 ENCOUNTER — Telehealth: Payer: Self-pay

## 2022-04-22 NOTE — Telephone Encounter (Signed)
The pt has been cancelled as requested  

## 2022-04-22 NOTE — Telephone Encounter (Signed)
-----   Message from Lemar Lofty., MD sent at 04/22/2022  3:35 PM EDT ----- Regarding: Procedure Cancellation Curtis Stephens,  I spoke with Dr. Levora Angel about this patient.  His procedure for EUS is canceled at this time. Please remove him from the list.  GM

## 2022-05-04 ENCOUNTER — Ambulatory Visit (HOSPITAL_COMMUNITY): Admit: 2022-05-04 | Payer: 59 | Admitting: Gastroenterology

## 2022-05-04 ENCOUNTER — Encounter (HOSPITAL_COMMUNITY): Payer: Self-pay

## 2022-05-04 SURGERY — UPPER ENDOSCOPIC ULTRASOUND (EUS) RADIAL
Anesthesia: Monitor Anesthesia Care

## 2022-05-11 ENCOUNTER — Telehealth: Payer: Self-pay | Admitting: Genetic Counselor

## 2022-05-11 NOTE — Telephone Encounter (Signed)
Scheduled appt per 6/19 referral. Pt is aware of appt date and time. Pt is aware to arrive 15 mins prior to appt time and to bring and updated insurance card. Pt is aware of appt location.   

## 2022-05-13 ENCOUNTER — Ambulatory Visit
Admission: RE | Admit: 2022-05-13 | Discharge: 2022-05-13 | Disposition: A | Discharge status: Home / Self Care | Payer: 59 | Source: Ambulatory Visit | Attending: Gastroenterology | Admitting: Gastroenterology

## 2022-05-13 DIAGNOSIS — K8689 Other specified diseases of pancreas: Secondary | ICD-10-CM

## 2022-05-13 MED ORDER — GADOBENATE DIMEGLUMINE 529 MG/ML IV SOLN
12.0000 mL | Freq: Once | INTRAVENOUS | Status: AC | PRN
  Administered 2022-05-13: 12 mL via INTRAVENOUS

## 2022-05-22 ENCOUNTER — Ambulatory Visit (INDEPENDENT_AMBULATORY_CARE_PROVIDER_SITE_OTHER): Payer: 59 | Admitting: Podiatry

## 2022-05-22 DIAGNOSIS — M216X2 Other acquired deformities of left foot: Secondary | ICD-10-CM | POA: Diagnosis not present

## 2022-05-22 DIAGNOSIS — M216X1 Other acquired deformities of right foot: Secondary | ICD-10-CM

## 2022-05-25 ENCOUNTER — Telehealth: Payer: Self-pay

## 2022-05-26 NOTE — Telephone Encounter (Signed)
Pt called about the powersteps that Dr Allena Katz recommended he called yesterday and has not heard back.   Looked in chart and gave him the name that Dr Allena Katz recommended and he said thank you.

## 2022-05-27 NOTE — Telephone Encounter (Signed)
Noted, thanks!

## 2022-05-29 NOTE — Progress Notes (Signed)
  Subjective:  Patient ID: Curtis Stephens, male    DOB: 12-08-1985,  MRN: [id number]  Chief Complaint  Patient presents with   Callouses    36 y.o. male presents with the above complaint.  Patient presents with complaint bilateral submetatarsal 1 hyperkeratotic lesion/callus formation.  Patient states sometimes painful he started notice getting more more buildup.  He tries to keep it shaved down but wanted to figure out the etiology of it.  He has not seen anyone else prior to seeing me he denies any other acute complaints.  Mild pain.   Review of Systems: Negative except as noted in the HPI. Denies N/V/F/Ch.  Past Medical History:  Diagnosis Date   Elevated transaminase level    Hyperlipidemia    Hypertension    Rectal bleeding     Current Outpatient Medications:    calcium carbonate (CALCIUM 600) 600 MG TABS tablet, Take 600 mg by mouth daily with breakfast., Disp: , Rfl:    Cholecalciferol (VITAMIN D3) 50 MCG (2000 UT) capsule, Take 2,000 Units by mouth daily., Disp: , Rfl:    cyclobenzaprine (FLEXERIL) 5 MG tablet, Take 5-10 mg by mouth at bedtime as needed for muscle pain., Disp: , Rfl:    lisinopril (PRINIVIL,ZESTRIL) 20 MG tablet, Take 20 mg by mouth daily., Disp: , Rfl:    omeprazole (PRILOSEC) 40 MG capsule, Take 40 mg by mouth 2 (two) times daily., Disp: , Rfl:    PRALUENT 75 MG/ML SOAJ, Inject 75 mg into the skin every 14 (fourteen) days., Disp: , Rfl:   Social History   Tobacco Use  Smoking Status Some Days  Smokeless Tobacco Never    Allergies  Allergen Reactions   Nsaids Other (See Comments)    Bleeding in stool   Crestor [Rosuvastatin Calcium] Other (See Comments)    Elevated liver functions   Meat Extract Other (See Comments)    Patient is vegetarian    Other Hives, Swelling and Rash    Iodine or orange/ family history   Objective:  There were no vitals filed for this visit. There is no height or weight on file to calculate BMI. Constitutional Well  developed. Well nourished.  Vascular Dorsalis pedis pulses palpable bilaterally. Posterior tibial pulses palpable bilaterally. Capillary refill normal to all digits.  No cyanosis or clubbing noted. Pedal hair growth normal.  Neurologic Normal speech. Oriented to person, place, and time. Epicritic sensation to light touch grossly present bilaterally.  Dermatologic Thick hyperkeratotic lesion to submetatarsal 1 likely due to plantarflexed metatarsal.  No central nucleated core noted.  No pinpoint bleeding noted upon debridement  Orthopedic: Normal joint ROM without pain or crepitus bilaterally. No visible deformities. No bony tenderness.   Radiographs: None Assessment:   1. 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 Plantar flexed metatarsal, left    Plan:  Patient was evaluated and treated and all questions answered.  Bilateral submetatarsal 1 callus with underlying plantarflexed metatarsal -I explained to the patient the etiology of callus formation and various treatment options were discussed.  I ultimately discussed with the shoe gear modification and offloading.  He states understanding will want to do so.  If there is no improvement we will discuss custom orthotics.  Also discussed power steps as well.  No follow-ups on file.

## 2022-06-30 ENCOUNTER — Inpatient Hospital Stay: Payer: 59

## 2022-06-30 ENCOUNTER — Inpatient Hospital Stay: Payer: 59 | Attending: Genetic Counselor | Admitting: Genetic Counselor

## 2022-06-30 ENCOUNTER — Other Ambulatory Visit: Payer: Self-pay

## 2022-06-30 ENCOUNTER — Encounter: Payer: Self-pay | Admitting: Genetic Counselor

## 2022-06-30 DIAGNOSIS — K859 Acute pancreatitis without necrosis or infection, unspecified: Secondary | ICD-10-CM | POA: Diagnosis not present

## 2022-06-30 NOTE — Progress Notes (Signed)
REFERRING PROVIDER: Otis Brace, MD [address] [place],  [place] [address]  PRIMARY PROVIDER:  Merrilee Seashore, MD  PRIMARY REASON FOR VISIT:  1. Acute pancreatitis, unspecified complication status, unspecified pancreatitis type     HISTORY OF PRESENT ILLNESS:   Curtis Stephens, a 36 y.o. male, was seen for a [facility] cancer genetics consultation at the request of Dr. Alessandra Bevels due to a personal and family history of pancreatitis.  Curtis Stephens presents to clinic today to discuss the possibility of a hereditary predisposition to cancer, to discuss genetic testing, and to further clarify his future cancer risks, as well as potential cancer risks for family members.   Curtis Stephens is a 36 y.o. male with no personal history of cancer.    Past Medical History:  Diagnosis Date   Elevated transaminase level    Hyperlipidemia    Hypertension    Rectal bleeding     Past Surgical History:  Procedure Laterality Date   ESOPHAGOGASTRODUODENOSCOPY (EGD) WITH PROPOFOL N/A 04/03/2022   Procedure: ESOPHAGOGASTRODUODENOSCOPY (EGD) WITH PROPOFOL;  Surgeon: Carol Ada, MD;  Location: [facility];  Service: Gastroenterology;  Laterality: N/A;   FINE NEEDLE ASPIRATION N/A 04/03/2022   Procedure: FINE NEEDLE ASPIRATION (FNA) LINEAR;  Surgeon: Carol Ada, MD;  Location: [facility];  Service: Gastroenterology;  Laterality: N/A;   TONSILLECTOMY     UPPER ESOPHAGEAL ENDOSCOPIC ULTRASOUND (EUS) N/A 04/03/2022   Procedure: UPPER ESOPHAGEAL ENDOSCOPIC ULTRASOUND (EUS);  Surgeon: Carol Ada, MD;  Location: [facility];  Service: Gastroenterology;  Laterality: N/A;    Social History   Socioeconomic History   Marital status: Married    Spouse name: Not on file   Number of children: Not on file   Years of education: Not on file   Highest education level: Not on file  Occupational History   Not on file  Tobacco Use   Smoking status: Some Days   Smokeless tobacco: Never  Substance  and Sexual Activity   Alcohol use: Not Currently   Drug use: Never   Sexual activity: Not on file  Other Topics Concern   Not on file  Social History Narrative   Not on file   Social Determinants of Health   Financial Resource Strain: Not on file  Food Insecurity: Not on file  Transportation Needs: Not on file  Physical Activity: Not on file  Stress: Not on file  Social Connections: Not on file     FAMILY HISTORY:  We obtained a detailed, 4-generation family history.  Significant diagnoses are listed below: Family History  Problem Relation Age of Onset   Diabetes Father    Hypertension Father    Pancreatitis Father 71 - 69       smoked, ETOH     Curtis Stephens father was diagnosed with pancreatitis in his 77s, he smoked and consumed alcohol, he died at age 55. Curtis Stephens is unaware of previous family history of genetic testing for hereditary cancer risks. There is no reported Ashkenazi Jewish ancestry.   GENETIC COUNSELING ASSESSMENT: Curtis Stephens is a 36 y.o. male with a personal and family history of pancreatitis which is somewhat suggestive of a hereditary predisposition to pancreatitis/cancer. We, therefore, discussed and recommended the following at today's visit.   DISCUSSION: We discussed that 5 - 10% of cancer is hereditary, with most cases of pancreatitis associated with PRSS1 and SPINK1.  There are other genes that can be associated with hereditary pancreatitis syndromes.  We discussed that testing is beneficial  for several reasons including knowing how to follow individuals and understanding if other family members could be at in increased risk for pancreatitis and/or cancer.  We reviewed the characteristics, features and inheritance patterns of hereditary cancer syndromes. We also discussed genetic testing, including the appropriate family members to test, the process of testing, insurance coverage and turn-around-time for results. We discussed the implications of a negative,  positive, carrier and/or variant of uncertain significant result. We discussed that negative results would be uninformative given that Curtis Stephens does not have a personal history of cancer. We recommended Curtis Stephens pursue genetic testing for a panel that contains genes associated with pancreatitis.  Curtis Stephens was offered a common hereditary cancer panel (47 genes+ pancreatitis genes) and an expanded pan-cancer panel (84 genes+pancreatitis genes). Curtis Stephens was informed of the benefits and limitations of each panel, including that expanded pan-cancer panels contain several genes that do not have clear management guidelines at this point in time.  We also discussed that as the number of genes included on a panel increases, the chances of variants of uncertain significance increases.  After considering the benefits and limitations of each gene panel, Curtis Stephens elected to have [facility] Multi-Cancer Panel with add-on pancreatitis genes.  The Multi-Cancer + RNA Panel offered by [facility] includes sequencing and/or deletion/duplication analysis of the following 84 genes:  AIP*, ALK, APC*, ATM*, AXIN2*, BAP1*, BARD1*, BLM*, BMPR1A*, BRCA1*, BRCA2*, BRIP1*, CASR, CDC73*, CDH1*, CDK4, CDKN1B*, CDKN1C*, CDKN2A, CEBPA, CHEK2*, CTNNA1*, DICER1*, DIS3L2*, EGFR, EPCAM, FH*, FLCN*, GATA2*, GPC3, GREM1, HOXB13, HRAS, KIT, MAX*, MEN1*, MET, MITF, MLH1*, MSH2*, MSH3*, MSH6*, MUTYH*, NBN*, NF1*, NF2*, NTHL1*, PALB2*, PDGFRA, PHOX2B, PMS2*, POLD1*, POLE*, POT1*, PRKAR1A*, PTCH1*, PTEN*, RAD50*, RAD51C*, RAD51D*, RB1*, RECQL4, RET, RUNX1*, SDHA*, SDHAF2*, SDHB*, SDHC*, SDHD*, SMAD4*, SMARCA4*, SMARCB1*, SMARCE1*, STK11*, SUFU*, TERC, TERT, TMEM127*, Tp53*, TSC1*, TSC2*, VHL*, WRN*, and WT1.  RNA analysis is performed for * genes. We also added 6 pancreatitis genes: CASR, CFTR, CPA1, CTRC, PRSS1, and SPINK1.    Curtis Stephens may have an out of pocket cost. We discussed that if his out of pocket cost for testing is over $100, the laboratory  will call and confirm whether he wants to proceed with testing.  If the out of pocket cost of testing is less than $100 he will be billed by the genetic testing laboratory.   We discussed that some people do not want to undergo genetic testing due to fear of genetic discrimination.  A federal law called the Genetic Information Non-Discrimination Act (GINA) of 2008 helps protect individuals against genetic discrimination based on their genetic test results.  It impacts both health insurance and employment.  With health insurance, it protects against increased premiums, being kicked off insurance or being forced to take a test in order to be insured.  For employment it protects against hiring, firing and promoting decisions based on genetic test results.  GINA does not apply to those in the [employer], those who work for companies with less than 15 employees, and new life insurance or long-term disability insurance policies.  Health status due to a cancer diagnosis is not protected under GINA.  PLAN: After considering the risks, benefits, and limitations, Curtis Stephens provided informed consent to pursue genetic testing and the blood sample was sent to [facility] for analysis of the Multi-Cancer Panel+ 6 add-on pancreatitis genes. Results should be available within approximately 2-3 weeks' time, at which point they will be disclosed by telephone to Curtis Stephens, as will any additional recommendations warranted  by these results. Curtis Stephens will receive a summary of his genetic counseling visit and a copy of his results once available. This information will also be available in Epic.   Curtis Stephens questions were answered to his satisfaction today. Our contact information was provided should additional questions or concerns arise. Thank you for the referral and allowing [place] to share in the care of your patient.   Lucille Passy, MS, [facility] Genetic Counselor [email or web address] (P) [phone]  The  patient was seen for a total of 40 minutes in face-to-face genetic counseling.  The patient brought his wife.  Drs. Lindi Adie and/or Burr Medico were available to discuss this case as needed.   _______________________________________________________________________ For Office Staff:  Number of people involved in session: 2 Was an Intern/ student involved with case: yes- [place]

## 2022-07-03 ENCOUNTER — Other Ambulatory Visit (HOSPITAL_COMMUNITY): Payer: Self-pay | Admitting: Gastroenterology

## 2022-07-03 ENCOUNTER — Other Ambulatory Visit: Payer: Self-pay | Admitting: Gastroenterology

## 2022-07-03 DIAGNOSIS — Z8719 Personal history of other diseases of the digestive system: Secondary | ICD-10-CM

## 2022-07-03 DIAGNOSIS — R109 Unspecified abdominal pain: Secondary | ICD-10-CM

## 2022-07-08 ENCOUNTER — Encounter (HOSPITAL_COMMUNITY)
Admission: RE | Admit: 2022-07-08 | Discharge: 2022-07-08 | Disposition: A | Discharge status: Home / Self Care | Payer: 59 | Source: Ambulatory Visit | Attending: Gastroenterology | Admitting: Gastroenterology

## 2022-07-08 DIAGNOSIS — Z8719 Personal history of other diseases of the digestive system: Secondary | ICD-10-CM | POA: Diagnosis present

## 2022-07-08 DIAGNOSIS — R109 Unspecified abdominal pain: Secondary | ICD-10-CM | POA: Diagnosis present

## 2022-07-08 MED ORDER — TECHNETIUM TC 99M MEBROFENIN IV KIT
5.2000 | PACK | Freq: Once | INTRAVENOUS | Status: AC | PRN
  Administered 2022-07-08: 5.2 via INTRAVENOUS

## 2022-07-15 ENCOUNTER — Encounter: Payer: Self-pay | Admitting: Genetic Counselor

## 2022-07-15 ENCOUNTER — Telehealth: Payer: Self-pay | Admitting: Genetic Counselor

## 2022-07-15 DIAGNOSIS — Z1379 Encounter for other screening for genetic and chromosomal anomalies: Secondary | ICD-10-CM | POA: Insufficient documentation

## 2022-07-15 NOTE — Telephone Encounter (Signed)
I contacted Mr. Gift to discuss his genetic testing results. No pathogenic variants were identified in the 89 genes analyzed. Of note, a variant of uncertain significance was identified in the [facility] gene. Detailed clinic note to follow.  The test report has been scanned into EPIC and is located under the Molecular Pathology section of the Results Review tab.  A portion of the result report is included below for reference.   Lucille Passy, MS, [facility] Genetic Counselor [email or web address] (P) [phone]

## 2022-08-03 ENCOUNTER — Encounter: Payer: Self-pay | Admitting: Genetic Counselor

## 2022-08-03 ENCOUNTER — Ambulatory Visit: Payer: Self-pay | Admitting: Genetic Counselor

## 2022-08-03 DIAGNOSIS — Z1379 Encounter for other screening for genetic and chromosomal anomalies: Secondary | ICD-10-CM

## 2022-08-03 NOTE — Progress Notes (Signed)
HPI:   Curtis Stephens was previously seen in the [place] clinic due to a personal and family history of pancreatitis. Please refer to our prior cancer genetics clinic note for more information regarding our discussion, assessment and recommendations, at the time. Curtis Stephens recent genetic test results were disclosed to him, as were recommendations warranted by these results. These results and recommendations are discussed in more detail below.  CANCER HISTORY:  Oncology History   No history exists.    FAMILY HISTORY:  We obtained a detailed, 4-generation family history.  Significant diagnoses are listed below:      Family History  Problem Relation Age of Onset   Diabetes Father     Hypertension Father     Pancreatitis Father 74 - 67        smoked, ETOH       Curtis Stephens father was diagnosed with pancreatitis in his 69s, he smoked and consumed alcohol, he died at age 71. Curtis Stephens is unaware of previous family history of genetic testing for hereditary cancer risks. There is no reported Ashkenazi Jewish ancestry.    GENETIC TEST RESULTS:  The [facility] Multi-Cancer Panel (84 genes)+ 6 Pancreatitis genes found no pathogenic mutations.   The Multi-Cancer + RNA Panel offered by [facility] includes sequencing and/or deletion/duplication analysis of the following 84 genes: AIP*, ALK, APC*, ATM*, AXIN2*, BAP1*, BARD1*, BLM*, BMPR1A*, BRCA1*, BRCA2*, BRIP1*, CASR, CDC73*, CDH1*, CDK4, CDKN1B*, CDKN1C*, CDKN2A, CEBPA, CHEK2*, CTNNA1*, DICER1*, DIS3L2*, EGFR, EPCAM, FH*, FLCN*, GATA2*, GPC3, GREM1, HOXB13, HRAS, KIT, MAX*, MEN1*, MET, MITF, MLH1*, MSH2*, MSH3*, MSH6*, MUTYH*, NBN*, NF1*, NF2*, NTHL1*, PALB2*, PDGFRA, PHOX2B, PMS2*, POLD1*, POLE*, POT1*, PRKAR1A*, PTCH1*, PTEN*, RAD50*, RAD51C*, RAD51D*, RB1*, RECQL4, RET, RUNX1*, SDHA*, SDHAF2*, SDHB*, SDHC*, SDHD*, SMAD4*, SMARCA4*, SMARCB1*, SMARCE1*, STK11*, SUFU*, TERC, TERT, TMEM127*, Tp53*, TSC1*, TSC2*, VHL*, WRN*, and WT1. RNA  analysis is performed for * genes. We also added 6 pancreatitis genes: CASR, CFTR, CPA1, CTRC, PRSS1, and SPINK1.   The test report has been scanned into EPIC and is located under the Molecular Pathology section of the Results Review tab.  A portion of the Stephens report is included below for reference. Genetic testing reported out on 07/15/2022.      Genetic testing identified a variant of uncertain significance (VUS) in the [facility] gene called [id number].  At this time, it is unknown if this variant is associated with an increased risk for cancer or if it is benign, but most uncertain variants are reclassified to benign. It should not be used to make medical management decisions. With time, we suspect the laboratory will determine the significance of this variant, if any. If the laboratory reclassifies this variant, we will attempt to contact Curtis Stephens to discuss it further.   Even though a pathogenic variant was not identified, possible explanations for the pancreatitis in the family may include: There may be no hereditary risk for pancreatitis in the family. His personal and family history may be due to other genetic or environmental factors. There may be a gene mutation in one of these genes that current testing methods cannot detect, but that chance is small. There could be another gene that has not yet been discovered, or that we have not yet tested, that is responsible for the pancreatitis diagnoses in the family.   Therefore, it is important to remain in touch with cancer genetics in the future so that we can continue to offer Curtis Stephens the most up to date genetic testing.  ADDITIONAL GENETIC TESTING:  We discussed with Curtis Stephens that his genetic testing was fairly extensive.  If there are genes identified to increase pancreatitis and/or cancer risk that can be analyzed in the future, we would be happy to discuss and coordinate this testing at that time.   CANCER SCREENING RECOMMENDATIONS:   Curtis Stephens is considered negative (normal).  This means that we have not identified a hereditary cause for his personal and family history of pancreatitis at this time.   An individual's cancer risk and medical management are not determined by genetic test results alone. Overall pancreatitis and cancer risk assessment incorporates additional factors, including personal medical history, family history, and any available genetic information that may Stephens in a personalized plan for cancer prevention and surveillance. Therefore, it is recommended he continue to follow the management and screening guidelines provided by his healthcare providers.  RECOMMENDATIONS FOR FAMILY MEMBERS:   Since he did not inherit a mutation in a pancreatitis and/or cancer predisposition gene included on this panel, his son could not have inherited a mutation from him in one of these genes. We do not recommend familial testing for the SMARCE1 variant of uncertain significance (VUS).  FOLLOW-UP:  Cancer genetics is a rapidly advancing field and it is possible that new genetic tests will be appropriate for him and/or his family members in the future. We encouraged him to remain in contact with cancer genetics on an annual basis so we can update his personal and family histories and let him know of advances in cancer genetics that may benefit this family.   Our contact number was provided. Mr. E[place]a questions were answered to his satisfaction, and he knows he is welcome to call us at anytime with additional questions or concerns.   Lucille Passy, MS, [facility] Genetic Counselor [email or web address] (P) [phone]

## 2022-08-28 ENCOUNTER — Encounter: Payer: Self-pay | Admitting: Podiatry

## 2022-08-28 ENCOUNTER — Ambulatory Visit (INDEPENDENT_AMBULATORY_CARE_PROVIDER_SITE_OTHER): Payer: 59 | Admitting: Podiatry

## 2022-08-28 DIAGNOSIS — M216X2 Other acquired deformities of left foot: Secondary | ICD-10-CM

## 2022-08-28 DIAGNOSIS — M216X1 Other acquired deformities of right foot: Secondary | ICD-10-CM

## 2022-08-28 NOTE — Progress Notes (Signed)
Subjective:  Patient ID: Curtis Stephens, male    DOB: Apr 01, 1986,  MRN: [id number]  Chief Complaint  Patient presents with   Foot Pain    36 y.o. male presents with the above complaint.  Patient presents for follow-up of bilateral submetatarsal 1 hyperkeratotic lesion/callus formation.  He states he is doing better than the PowerStep insole as well as shoe gear modification is helping.  Denies any other acute complaints   Review of Systems: Negative except as noted in the HPI. Denies N/V/F/Ch.  Past Medical History:  Diagnosis Date   Elevated transaminase level    Hyperlipidemia    Hypertension    Rectal bleeding     Current Outpatient Medications:    chlorhexidine (PERIDEX) 0.12 % solution, SMARTSIG:15 Milliliter(s) By Mouth Morning-Evening, Disp: , Rfl:    diclofenac (VOLTAREN) 75 MG EC tablet, Take by mouth., Disp: , Rfl:    erythromycin ophthalmic ointment, SMARTSIG:0.25 Inch(es) In Eye(s) Every Night, Disp: , Rfl:    hydrocortisone (PROCTOZONE-HC) 2.5 % rectal cream, Place rectally., Disp: , Rfl:    neomycin-polymyxin b-dexamethasone (MAXITROL) 3.5-10000-0.1 OINT, Place into the right eye at bedtime., Disp: , Rfl:    acetaminophen (TYLENOL) 500 MG tablet, As directed,as needed, Disp: , Rfl:    calcium carbonate (CALCIUM 600) 600 MG TABS tablet, Take 600 mg by mouth daily with breakfast., Disp: , Rfl:    Cholecalciferol (VITAMIN D3) 50 MCG (2000 UT) capsule, Take 2,000 Units by mouth daily., Disp: , Rfl:    cyclobenzaprine (FLEXERIL) 5 MG tablet, Take 5-10 mg by mouth at bedtime as needed for muscle pain., Disp: , Rfl:    Diclofenac Sodium 3 % GEL, , Disp: , Rfl:    Evolocumab (REPATHA) 140 MG/ML SOSY, Inject into the skin., Disp: , Rfl:    ezetimibe (ZETIA) 10 MG tablet, Take 10 mg by mouth daily., Disp: , Rfl:    gabapentin (NEURONTIN) 300 MG capsule, Take 1 capsule(s) every day by oral route at bedtime for 15 days., Disp: , Rfl:    lisinopril (PRINIVIL,ZESTRIL) 20 MG tablet,  Take 20 mg by mouth daily., Disp: , Rfl:    omeprazole (PRILOSEC) 40 MG capsule, Take 40 mg by mouth 2 (two) times daily., Disp: , Rfl:    PRALUENT 75 MG/ML SOAJ, Inject 75 mg into the skin every 14 (fourteen) days., Disp: , Rfl:   Social History   Tobacco Use  Smoking Status Some Days  Smokeless Tobacco Never    Allergies  Allergen Reactions   Nsaids Other (See Comments)    Bleeding in stool  Other Reaction(s): Other (See Comments)  Bleeding  Other Reaction(s): Not available   Statins     Other Reaction(s): Other (See Comments)  Caused elevated LFTs  Caused elevated LFTs    Elevated liver functions   Chicken Allergy     Other Reaction(s): Other (See Comments)  Patient is vegetarian   Crestor [Rosuvastatin Calcium] Other (See Comments)    Elevated liver functions   Meat Extract Other (See Comments)    Patient is vegetarian    Rosuvastatin     Other Reaction(s): Not available   Other Hives, Swelling and Rash    Iodine or orange/ family history   Objective:  There were no vitals filed for this visit. There is no height or weight on file to calculate BMI. Constitutional Well developed. Well nourished.  Vascular Dorsalis pedis pulses palpable bilaterally. Posterior tibial pulses palpable bilaterally. Capillary refill normal to all digits.  No cyanosis or clubbing  noted. Pedal hair growth normal.  Neurologic Normal speech. Oriented to person, place, and time. Epicritic sensation to light touch grossly present bilaterally.  Dermatologic Proving thick hyperkeratotic lesion to submetatarsal 1 likely due to plantarflexed metatarsal.  No central nucleated core noted.  No pinpoint bleeding noted upon debridement  Orthopedic: Normal joint ROM without pain or crepitus bilaterally. No visible deformities. No bony tenderness.   Radiographs: None Assessment:   1. 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 Plantar flexed metatarsal, left     Plan:  Patient  was evaluated and treated and all questions answered.  Bilateral submetatarsal 1 callus with underlying plantarflexed metatarsal -Niccoli doing well with shoe gear modification and power steps orthotics.  Patient states that he also can monitor closely.  To help with in-house.  He denies any other acute complaints.  There appears.  Slight reduction in submet 1 callus.  I discussed if there is continuous pain in the arches heels of the forefoot we can discuss custom orthotics at that time.  He states understanding  No follow-ups on file.

## 2022-09-29 ENCOUNTER — Other Ambulatory Visit (HOSPITAL_COMMUNITY): Payer: Self-pay | Admitting: Gastroenterology

## 2022-09-29 DIAGNOSIS — K861 Other chronic pancreatitis: Secondary | ICD-10-CM

## 2022-10-04 ENCOUNTER — Other Ambulatory Visit (HOSPITAL_COMMUNITY): Payer: Self-pay | Admitting: Gastroenterology

## 2022-10-04 ENCOUNTER — Ambulatory Visit (HOSPITAL_COMMUNITY)
Admission: RE | Admit: 2022-10-04 | Discharge: 2022-10-04 | Disposition: A | Discharge status: Home / Self Care | Payer: 59 | Source: Ambulatory Visit | Attending: Gastroenterology | Admitting: Gastroenterology

## 2022-10-04 DIAGNOSIS — K861 Other chronic pancreatitis: Secondary | ICD-10-CM

## 2022-10-04 MED ORDER — GADOBUTROL 1 MMOL/ML IV SOLN
6.0000 mL | Freq: Once | INTRAVENOUS | Status: AC | PRN
  Administered 2022-10-04: 6 mL via INTRAVENOUS

## 2023-07-09 ENCOUNTER — Ambulatory Visit (HOSPITAL_COMMUNITY)
Admission: RE | Admit: 2023-07-09 | Discharge: 2023-07-09 | Disposition: A | Discharge status: Home / Self Care | Payer: 59 | Source: Ambulatory Visit | Attending: Gastroenterology | Admitting: Gastroenterology

## 2023-07-09 ENCOUNTER — Other Ambulatory Visit (HOSPITAL_COMMUNITY): Payer: Self-pay | Admitting: Gastroenterology

## 2023-07-09 DIAGNOSIS — Z8719 Personal history of other diseases of the digestive system: Secondary | ICD-10-CM

## 2023-07-09 DIAGNOSIS — R748 Abnormal levels of other serum enzymes: Secondary | ICD-10-CM

## 2023-07-09 DIAGNOSIS — R109 Unspecified abdominal pain: Secondary | ICD-10-CM | POA: Insufficient documentation

## 2023-07-09 MED ORDER — GADOBUTROL 1 MMOL/ML IV SOLN
6.0000 mL | Freq: Once | INTRAVENOUS | Status: AC | PRN
  Administered 2023-07-09: 6 mL via INTRAVENOUS

## 2024-02-01 ENCOUNTER — Other Ambulatory Visit (HOSPITAL_BASED_OUTPATIENT_CLINIC_OR_DEPARTMENT_OTHER): Payer: Self-pay | Admitting: Internal Medicine

## 2024-02-01 DIAGNOSIS — E782 Mixed hyperlipidemia: Secondary | ICD-10-CM

## 2024-02-16 ENCOUNTER — Ambulatory Visit (HOSPITAL_COMMUNITY)
Admission: RE | Admit: 2024-02-16 | Discharge: 2024-02-16 | Disposition: A | Discharge status: Home / Self Care | Payer: Self-pay | Source: Ambulatory Visit | Attending: Internal Medicine | Admitting: Internal Medicine

## 2024-02-16 DIAGNOSIS — E782 Mixed hyperlipidemia: Secondary | ICD-10-CM | POA: Insufficient documentation

## 2024-06-23 ENCOUNTER — Other Ambulatory Visit: Payer: Self-pay | Admitting: Gastroenterology

## 2024-06-23 DIAGNOSIS — R935 Abnormal findings on diagnostic imaging of other abdominal regions, including retroperitoneum: Secondary | ICD-10-CM

## 2024-06-23 DIAGNOSIS — K859 Acute pancreatitis without necrosis or infection, unspecified: Secondary | ICD-10-CM

## 2024-06-24 NOTE — Progress Notes (Unsigned)
  Cardiology Office Note:  .   Date:  06/24/2024  ID:  Curtis Stephens, DOB 1985-12-20, MRN [id number] PCP: Curtis Lombard, MD  [facility] Providers Cardiologist:  None { Click to update primary MD,subspecialty MD or APP then REFRESH:1}  History of Present Illness: .   Curtis Stephens is a 39 y.o. Asian [place] male patient with primary hypertension, mixed hypercholesterolemia, nonalcoholic fatty liver, autoimmune sclerosing pancreatitis and hypovitaminosis D who underwent coronary calcium scoring on 02/20/2024 revealing total score of 21 in the 86 percentile for age 2 ([place] database percentile not available for age 84).  Patient has been on Repatha for hypercholesterolemia and now referred for cardiac risk stratification and recommendation regarding primary prevention by Dr. Michelina Curtis.  Cardiac Studies relevent.    Coronary calcium score 02/20/2024: Coronary calcium score of 21. Percentile not available for age less than 37, but would be 86th percentile for age 1 Left circumflex artery: 63.   Right coronary artery: 2    Discussed the use of AI scribe software for clinical note transcription with the patient, who gave verbal consent to proceed.  History of Present Illness    Labs   Care everywhere/Faxed External Labs:  Labs 01/10/2024:  Total cholesterol 197, triglycerides 245, HDL 45, LDL 110.  Serum glucose 92 mg, BUN 14, creatinine 1.01, eGFR 96 mL, potassium 4.4, AST 29, ALT mildly elevated at 46 (0-44).  A1c 5.3%.  TSH normal at 2.590.  Hb 13.9/HCT 41.8, platelets 321, normal indicis.  Labs 08/02/2023:  Total cholesterol 132, triglycerides 198, HDL 40, LDL 59.  ROS  ***ROS Physical Exam:   VS:  There were no vitals taken for this visit.   Wt Readings from Last 3 Encounters:  04/03/22 132 lb (59.9 kg)  06/01/18 159 lb (72.1 kg)    BP Readings from Last 3 Encounters:  04/03/22 (!) 126/96  06/01/18 (!) 127/92   ***Physical Exam EKG:          ASSESSMENT AND PLAN: .      ICD-10-CM   1. Elevated coronary artery calcium score 02/20/2024: Total score 21, 86th percentile for age 26  R93.1     2. Mixed hypercholesterolemia and hypertriglyceridemia  E78.2     3. Primary hypertension  I10     4. Nonalcoholic fatty liver disease without nonalcoholic steatohepatitis (NASH)  K76.0     5. Autoimmune pancreatitis (HCC)  K86.1       Assessment and Plan Assessment & Plan    Follow up: ***  Signed,  Curtis Bergamo, MD, [facility] 06/24/2024, 9:49 AM [facility] [address] [place], [place] [address] Phone: [phone]. Fax:  [phone]

## 2024-06-30 ENCOUNTER — Encounter: Payer: Self-pay | Admitting: Cardiology

## 2024-06-30 ENCOUNTER — Ambulatory Visit: Attending: Cardiology | Admitting: Cardiology

## 2024-06-30 VITALS — BP 135/89 | HR 78 | Resp 16 | Ht 65.0 in | Wt 142.4 lb

## 2024-06-30 DIAGNOSIS — K861 Other chronic pancreatitis: Secondary | ICD-10-CM | POA: Diagnosis not present

## 2024-06-30 DIAGNOSIS — I10 Essential (primary) hypertension: Secondary | ICD-10-CM

## 2024-06-30 DIAGNOSIS — R931 Abnormal findings on diagnostic imaging of heart and coronary circulation: Secondary | ICD-10-CM | POA: Diagnosis not present

## 2024-06-30 DIAGNOSIS — K760 Fatty (change of) liver, not elsewhere classified: Secondary | ICD-10-CM

## 2024-06-30 DIAGNOSIS — E782 Mixed hyperlipidemia: Secondary | ICD-10-CM | POA: Diagnosis not present

## 2024-06-30 NOTE — Patient Instructions (Signed)
 Medication Instructions:  No medication changes were made at this visit. Continue current regimen.   *If you need a refill on your cardiac medications before your next appointment, please call your pharmacy*  Lab Work: FASTING LIPID PANEL DIRECT LDL LP(a) If you have labs (blood work) drawn today and your tests are completely normal, you will receive your results only by: MyChart Message (if you have MyChart) OR A paper copy in the mail If you have any lab test that is abnormal or we need to change your treatment, we will call you to review the results.  Testing/Procedures: Routine Treadmill Stress Test  Your physician has requested that you have an exercise tolerance test. For further information please visit [email or web address] Please also follow instruction sheet, as given.   Follow-Up: At [facility], you and your health needs are our priority.  As part of our continuing mission to provide you with exceptional heart care, our providers are all part of one team.  This team includes your primary Cardiologist (physician) and Advanced Practice Providers or APPs (Physician Assistants and Nurse Practitioners) who all work together to provide you with the care you need, when you need it.  Your next appointment:   Prior to October 16th, 2025  Provider:   Gordy Bergamo, MD    We recommend signing up for the patient portal called MyChart.  Sign up information is provided on this After Visit Summary.  MyChart is used to connect with patients for Virtual Visits (Telemedicine).  Patients are able to view lab/test results, encounter notes, upcoming appointments, etc.  Non-urgent messages can be sent to your provider as well.   To learn more about what you can do with MyChart, go to [email or web address].   Other Instructions Exercise Tolerance Test  Please arrive 15 minutes prior to your appointment time for registration and insurance purposes.  The test will take approximately 45  minutes to complete.  How to prepare for your Exercise Stress Test: Do bring a list of your current medications with you.  If not listed below, you may take your medications as normal. Do wear comfortable clothes (no dresses or overalls) and walking shoes, tennis shoes preferred (no heels or open toed shoes are allowed) Do Not wear cologne, perfume, aftershave or lotions (deodorant is allowed).  If these instructions are not followed, your test will have to be rescheduled.  If you have questions or concerns about your appointment, you can call the Stress Lab at [phone].  If you cannot keep your appointment, please provide 24 hours notification to the Stress Lab, to avoid a possible $50 charge to your account.

## 2024-07-05 ENCOUNTER — Ambulatory Visit
Admission: RE | Admit: 2024-07-05 | Discharge: 2024-07-05 | Disposition: A | Discharge status: Home / Self Care | Source: Ambulatory Visit | Attending: Gastroenterology | Admitting: Gastroenterology

## 2024-07-05 DIAGNOSIS — R935 Abnormal findings on diagnostic imaging of other abdominal regions, including retroperitoneum: Secondary | ICD-10-CM

## 2024-07-05 DIAGNOSIS — K859 Acute pancreatitis without necrosis or infection, unspecified: Secondary | ICD-10-CM

## 2024-07-05 MED ORDER — GADOPICLENOL 0.5 MMOL/ML IV SOLN
6.0000 mL | Freq: Once | INTRAVENOUS | Status: AC | PRN
  Administered 2024-07-05: 6 mL via INTRAVENOUS

## 2024-07-10 ENCOUNTER — Encounter (HOSPITAL_COMMUNITY): Payer: Self-pay | Admitting: *Deleted

## 2024-07-14 ENCOUNTER — Ambulatory Visit: Payer: Self-pay | Admitting: Cardiology

## 2024-07-14 LAB — LIPOPROTEIN A (LPA): Lipoprotein (a): <8.4 nmol/L (ref <75.0)

## 2024-07-14 LAB — LIPID PANEL
Chol/HDL Ratio: 4 ratio (ref 0.0–5.0)
Cholesterol, Total: 159 mg/dL (ref 100–199)
HDL: 40 mg/dL (ref >39)
LDL Chol Calc (NIH): 80 mg/dL (ref 0–99)
Triglycerides: 236 mg/dL — ABNORMAL HIGH (ref 0–149)
VLDL Cholesterol Cal: 39 mg/dL (ref 5–40)

## 2024-07-14 LAB — LDL CHOLESTEROL, DIRECT: LDL Direct: 92 mg/dL (ref 0–99)

## 2024-07-16 NOTE — Progress Notes (Signed)
 Labs 01/10/2024:   Total cholesterol 197, triglycerides 245, HDL 45, LDL 110.  LDL has improved and TG are still high and will discuss on your visit Normal Lpa and Direct LDL is 90

## 2024-07-19 ENCOUNTER — Ambulatory Visit (HOSPITAL_COMMUNITY): Payer: Self-pay

## 2024-07-21 ENCOUNTER — Ambulatory Visit (HOSPITAL_COMMUNITY)
Admission: RE | Admit: 2024-07-21 | Discharge: 2024-07-21 | Disposition: A | Discharge status: Home / Self Care | Source: Ambulatory Visit | Attending: Cardiology | Admitting: Cardiology

## 2024-07-21 DIAGNOSIS — R931 Abnormal findings on diagnostic imaging of heart and coronary circulation: Secondary | ICD-10-CM | POA: Diagnosis present

## 2024-07-21 LAB — EXERCISE TOLERANCE TEST
Angina Index: 0
Duke Treadmill Score: 12
Estimated workload: 13.4
Exercise duration (min): 11 min
Exercise duration (sec): 36 s
MPHR: 180 {beats}/min
Peak HR: 181 {beats}/min
Percent HR: 100 %
RPE: 18
Rest HR: 80 {beats}/min
ST Depression (mm): 0 mm

## 2024-07-21 NOTE — Progress Notes (Signed)
 Normal stress test with excellent exercise tolerance, no further evaluation is indicated.

## 2024-07-27 ENCOUNTER — Encounter: Payer: Self-pay | Admitting: Cardiology

## 2024-07-27 ENCOUNTER — Ambulatory Visit: Attending: Cardiology | Admitting: Cardiology

## 2024-07-27 VITALS — BP 131/84 | HR 70 | Ht 65.0 in | Wt 143.3 lb

## 2024-07-27 DIAGNOSIS — E782 Mixed hyperlipidemia: Secondary | ICD-10-CM | POA: Diagnosis not present

## 2024-07-27 DIAGNOSIS — I10 Essential (primary) hypertension: Secondary | ICD-10-CM

## 2024-07-27 DIAGNOSIS — R931 Abnormal findings on diagnostic imaging of heart and coronary circulation: Secondary | ICD-10-CM

## 2024-07-27 MED ORDER — OLMESARTAN MEDOXOMIL 40 MG PO TABS: 40.0000 mg | ORAL_TABLET | Freq: Every day | ORAL | 3 refills | Status: AC

## 2024-07-27 NOTE — Progress Notes (Signed)
 Cardiology Office Note:  .   Date:  07/27/2024  ID:  Curtis Stephens, DOB [birth year], MRN [id number] PCP: Verdia Lombard, MD  [facility] Providers Cardiologist:  Gordy Bergamo, MD   History of Present Illness: .   Curtis Stephens is a 38 y.o. Asian [place] male patient with primary hypertension, mixed hypercholesterolemia, nonalcoholic fatty liver, autoimmune sclerosing pancreatitis and hypovitaminosis D who underwent coronary calcium scoring on 02/20/2024 revealing total score of 21 in the 86 percentile for age 66 ([place] database percentile not available for age 82).  Obstructive sleep apnea previously on CPAP and I recommended reassessment of sleep study, underwent treadmill stress test and Lp(a) lipid profile and presents for follow-up.   Patient has been on Repatha for hypercholesterolemia. He remains asymptomatic but is concerned about strong family history of premature coronary disease in multiple members in his family. He remains asymptomatic.  Cardiac Studies relevent.    EXERCISE TOLERANCE TEST (ETT) 07/21/2024    A Bruce protocol stress test was performed. Patient exercised for 11 min and 36 sec. Maximum HR of 181 bpm. MPHR 100.0%. Peak METS 13.4. The patient experienced no angina during the test. The test was stopped because the patient experienced fatigue. The patient reported no symptoms during the stress test. Normal blood pressure response noted during stress.   No ST deviation was noted. There were no arrhythmias during stress. ECG was interpretable. The ECG was negative for ischemia.    Discussed the use of AI scribe software for clinical note transcription with the patient, who gave verbal consent to proceed.  History of Present Illness Curtis Stephens is a 38 year old male with hypertension and coronary artery disease who presents for a follow-up on his cardiovascular health and medication management. He was referred by Dr. Verdia for evaluation of his cardiovascular health  and medication management.  Blood pressure at home ranges between 125-135/80-85 mmHg. He takes lisinopril 30 mg daily, increased from 20 mg, with a goal of maintaining diastolic pressure under 80 mmHg.  He has elevated liver enzymes, preventing statin use, and is on Repatha for cholesterol management. Recent lipid panel shows cholesterol levels under 100 mg/dL. He is concerned about a slightly high coronary calcium score but notes improved cholesterol levels.  A recent stress test exceeded expected duration by two and a half minutes, reaching level four. A recent MRI was stable.  Family history includes heart disease, with an acknowledged increased risk of heart attack among Indians. He is married with grown children and involved in the [place] community, though less actively.  No current symptoms of chest pain or discomfort. Chronic pancreatitis is stable.   Labs   Lab Results  Component Value Date   CHOL 159 07/13/2024   HDL 40 07/13/2024   LDLCALC 80 07/13/2024   LDLDIRECT 92 07/13/2024   TRIG 236 (H) 07/13/2024   CHOLHDL 4.0 07/13/2024   Lipoprotein (a)  Date/Time Value Ref Range Status  07/13/2024 08:12 AM <8.4 <75.0 nmol/L Final    Comment:    **Results verified by repeat testing** Note:  Values greater than or equal to 75.0 nmol/L may        indicate an independent risk factor for CHD,        but must be evaluated with caution when applied        to non-Caucasian populations due to the        influence of genetic factors on Lp(a) across        ethnicities.  Care everywhere/Faxed External Labs:  Labs 01/10/2024:   Total cholesterol 197, triglycerides 245, HDL 45, LDL 110.   Serum glucose 92 mg, BUN 14, creatinine 1.01, eGFR 96 mL, potassium 4.4, AST 29, ALT mildly elevated at 46 (0-44).   A1c 5.3%.  TSH normal at 2.590.  ROS  Review of Systems  Cardiovascular:  Negative for chest pain, dyspnea on exertion and leg swelling.   Physical Exam:   VS:  BP 131/84    Pulse 70   Ht 5' 5 (1.651 m)   Wt 143 lb 4.8 oz (65 kg)   SpO2 99%   BMI 23.85 kg/m    Wt Readings from Last 3 Encounters:  07/27/24 143 lb 4.8 oz (65 kg)  06/30/24 142 lb 6.4 oz (64.6 kg)  04/03/22 132 lb (59.9 kg)    BP Readings from Last 3 Encounters:  07/27/24 131/84  06/30/24 135/89  04/03/22 (!) 126/96   Physical Exam Neck:     Vascular: No carotid bruit or JVD.  Cardiovascular:     Rate and Rhythm: Normal rate and regular rhythm.     Pulses: Intact distal pulses.     Heart sounds: Normal heart sounds. No murmur heard.    No gallop.  Pulmonary:     Effort: Pulmonary effort is normal.     Breath sounds: Normal breath sounds.  Abdominal:     General: Bowel sounds are normal.     Palpations: Abdomen is soft.  Musculoskeletal:     Right lower leg: No edema.     Left lower leg: No edema.    EKG:         ASSESSMENT AND PLAN: .      ICD-10-CM   1. Primary hypertension  I10 olmesartan (BENICAR) 40 MG tablet    2. Elevated coronary artery calcium score  R93.1     3. Mixed hypercholesterolemia and hypertriglyceridemia  E78.2      Assessment & Plan Hypertension Hypertension is currently managed with lisinopril 30 mg. Blood pressure readings are generally between 125-135/80-85 mmHg. The goal is to maintain diastolic pressure below 80 mmHg consistently. Lisinopril will be replaced with olmesartan 40 mg once daily, which is expected to provide better blood pressure control. Olmesartan is a generic medication and is considered slightly superior to lisinopril. It is metabolized through the liver but does not significantly affect liver function. There is no known interaction with chronic pancreatitis. - Discontinue lisinopril - Start olmesartan 40 mg once daily - Monitor blood pressure at home - Check CMP in 3-4 weeks to assess liver and kidney function - If any side effects occur, revert to lisinopril  Atherosclerotic coronary artery disease Coronary calcium score  is slightly high, indicating plaque buildup. However, the concern is more with soft plaque, which can lead to heart attacks or strokes. Current management with Repatha is effective in reducing soft plaque. He is on optimal medical therapy, which significantly reduces the risk of major cardiac events. Recent stress test and MRI show stable results with no immediate concerns. He is advised not to worry about the coronary calcium score as it is not indicative of current risk once on lipid-lowering therapy. - Continue Repatha - No restrictions on activities - No need for repeat coronary calcium score - Consider angiography only if symptoms develop  Hyperlipidemia Cholesterol levels are well controlled with current therapy, with levels less than 100 mg/dL. He is on Repatha due to intolerance to statins caused by liver enzyme elevation. Current lipid management  is effective in reducing cardiovascular risk. - Continue Repatha  Chronic pancreatitis Chronic pancreatitis is well-managed with current therapy. There is no known interaction between olmesartan and chronic pancreatitis. - Continue current management for chronic pancreatitis   Follow up: PRN  Signed,  Gordy Bergamo, MD, [facility] 07/27/2024, 8:55 PM [facility] [address] [place], [place] [address] Phone: [phone]. Fax:  [phone]

## 2024-07-27 NOTE — Patient Instructions (Signed)
 Medication Instructions:  Your physician recommends that you continue on your current medications as directed. Please refer to the Current Medication list given to you today.  *If you need a refill on your cardiac medications before your next appointment, please call your pharmacy*  Lab Work: None.  If you have labs (blood work) drawn today and your tests are completely normal, you will receive your results only by: MyChart Message (if you have MyChart) OR A paper copy in the mail If you have any lab test that is abnormal or we need to change your treatment, we will call you to review the results.  Testing/Procedures: None.  Follow-Up: At [facility], you and your health needs are our priority.  As part of our continuing mission to provide you with exceptional heart care, our providers are all part of one team.  This team includes your primary Cardiologist (physician) and Advanced Practice Providers or APPs (Physician Assistants and Nurse Practitioners) who all work together to provide you with the care you need, when you need it.  Your next appointment will be as needed and it will be with:    Provider:   Gordy Bergamo, MD

## 2024-09-21 ENCOUNTER — Encounter: Payer: Self-pay | Admitting: Cardiology

## 2024-10-17 ENCOUNTER — Institutional Professional Consult (permissible substitution) (INDEPENDENT_AMBULATORY_CARE_PROVIDER_SITE_OTHER): Admitting: Otolaryngology

## 2024-11-08 ENCOUNTER — Ambulatory Visit (INDEPENDENT_AMBULATORY_CARE_PROVIDER_SITE_OTHER): Admitting: Otolaryngology

## 2024-11-08 ENCOUNTER — Encounter (INDEPENDENT_AMBULATORY_CARE_PROVIDER_SITE_OTHER): Payer: Self-pay | Admitting: Otolaryngology

## 2024-11-08 VITALS — BP 116/78 | HR 79 | Ht 65.0 in | Wt 140.0 lb

## 2024-11-08 DIAGNOSIS — H9313 Tinnitus, bilateral: Secondary | ICD-10-CM

## 2024-11-08 NOTE — Progress Notes (Signed)
 Dear Dr. Verdia, Here is my assessment for our mutual patient, Curtis Stephens. Thank you for allowing me the opportunity to care for your patient. Please do not hesitate to contact me should you have any other questions. Sincerely, Dr. Eldora Tobie  Otolaryngology Clinic Note Referring provider: Dr. Verdia HPI:  Curtis Stephens is a 39 y.o. male kindly referred by Dr. Verdia for evaluation of tinnitus  Initial visit (10/2024): Patient reports: more tinnitus right > left but occurs bilaterally. Can change. Couple of months (end of November) after a URI. Worse when it is silent. High pitch. No pulse. Has happened without neck pain but neck pain makes it worse. Does have TMJ but better now. 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 Verdia Referral notes reviewed and uploaded or available in chart in media tab (09/04/2024): Right tinnitus, noted neck pain and recurrent arthritis;  CBC and CMP 04/10/2022: BUN/Cr 11/0.7; WBC 4.6 CTH 11/17/2019 independently reviewed with respect to ear: mastoids and ME well aerated; no noted otic capsule or ossicular chain pathology; cuts thick so study suboptimal for ear PMH/Meds/All/SocHx/FamHx/ROS:   Past Medical History:  Diagnosis Date   Elevated transaminase level    Hyperlipidemia    Hypertension    Rectal bleeding      Past Surgical History:   Procedure Laterality Date   ESOPHAGOGASTRODUODENOSCOPY (EGD) WITH PROPOFOL  N/A 04/03/2022   Procedure: ESOPHAGOGASTRODUODENOSCOPY (EGD) WITH PROPOFOL ;  Surgeon: Rollin Dover, MD;  Location: [facility];  Service: Gastroenterology;  Laterality: N/A;   FINE NEEDLE ASPIRATION N/A 04/03/2022   Procedure: FINE NEEDLE ASPIRATION (FNA) LINEAR;  Surgeon: Rollin Dover, MD;  Location: [facility];  Service: Gastroenterology;  Laterality: N/A;   TONSILLECTOMY     UPPER ESOPHAGEAL ENDOSCOPIC ULTRASOUND (EUS) N/A 04/03/2022   Procedure: UPPER ESOPHAGEAL ENDOSCOPIC ULTRASOUND (EUS);  Surgeon: Rollin Dover, MD;  Location: [facility];  Service: Gastroenterology;  Laterality: N/A;    Family History  Problem Relation Age of Onset   Hyperlipidemia Mother    Hypertension Mother    Arthritis Mother    Diabetes Father    Hypertension Father    Pancreatitis Father 91 - 3       smoked, ETOH     Social Connections: Unknown (03/03/2022)   Received [facility]   Social Network    Social Network: Not on file     Current Medications[1]   Physical Exam:   BP 116/78 (BP Location: Left Arm, Patient Position: Sitting, Cuff Size: Large)   Pulse 79   Ht 5' 5 (1.651 m)   Wt 140 lb (63.5 kg)   SpO2 98%   BMI 23.30 kg/m   Salient findings:  CN II-XII intact Given history and complaints, ear microscopy was indicated and performed for evaluation with findings as below in physical exam section and in procedures; Bilateral EAC clear (mild cerumen) and TM intact with well pneumatized middle ear spaces Weber 512: mid Rinne 512: AC > BC b/l  Anterior rhinoscopy: Septum intact;  bilateral inferior turbinates without significant hypertrophy No lesions of oral cavity/oropharynx No obviously palpable neck masses/lymphadenopathy/thyromegaly No respiratory distress or stridor  Seprately Identifiable Procedures:  Prior to initiating any procedures, risks/benefits/alternatives were explained to the patient  and verbal consent obtained. Procedure: Bilateral ear microscopy using microscope (CPT [id number]) Pre-procedure diagnosis: bilateral tinnitus Post-procedure diagnosis: same Indication: see above; given patient's otologic complaints and history, for improved and comprehensive examination of external ear and tympanic membrane, bilateral otologic examination using microscope was performed. Prior to proceeding, verbal consent was obtained after discussion of R/B/A  Procedure: Patient was placed semi-recumbent. Both ear canals were examined using the microscope with findings above. Patient tolerated the procedure well.   Impression & Plans:  Curtis Stephens is a 39 y.o. male with:  1. Tinnitus of both ears    Noted b/l persistent tinnitus worse on the right. Overall his exam is reassuring. We discussed options and causes (cerumen -- virtually none today, hearing loss, somatic --- does have neck pain, medications --- not on ASA, and retrocochlear lesion --- unlikely given bilateral) Also discussed management including masking Will get audio first If persistent, can consider MRI  See below regarding exact medications prescribed this encounter including dosages and route: No orders of the defined types were placed in this encounter.     Thank you for allowing me the opportunity to care for your patient. Please do not hesitate to contact me should you have any other questions.  Sincerely, Eldora Tobie, MD Otolaryngologist (ENT), [facility] Phone: [phone] Fax: [phone]  11/08/2024, 8:17 AM   MDM:  [phone]      [1]  Current Outpatient Medications:    acetaminophen (TYLENOL) 500 MG tablet, As directed,as needed (Patient taking differently: as needed.), Disp: , Rfl:    calcium carbonate (CALCIUM 600) 600 MG TABS tablet, Take 600 mg by mouth daily with breakfast. (Patient taking differently: Take 600 mg by mouth daily with breakfast. PRN), Disp: , Rfl:     Cholecalciferol (VITAMIN D3) 50 MCG (2000 UT) capsule, Take 2,000 Units by mouth daily. (Patient taking differently: Take 2,000 Units by mouth daily. Prn), Disp: , Rfl:    cyclobenzaprine (FLEXERIL) 5 MG tablet, Take 5-10 mg by mouth at bedtime as needed for muscle pain., Disp: , Rfl:    diclofenac (VOLTAREN) 75 MG EC tablet, Take by mouth., Disp: , Rfl:    Diclofenac Sodium 3 % GEL, , Disp: , Rfl:    erythromycin ophthalmic ointment, SMARTSIG:0.25 Inch(es) In Eye(s) Every Night, Disp: , Rfl:    Evolocumab (REPATHA) 140 MG/ML SOSY, Inject into the skin., Disp: , Rfl:    gabapentin (NEURONTIN) 300 MG capsule, Take 1 capsule(s) every day by oral route at bedtime for 15 days., Disp: , Rfl:    lipase/protease/amylase (CREON) 36000 UNITS CPEP capsule, Take 36,000 Units by mouth 3 (three) times daily before meals., Disp: , Rfl:    olmesartan  (BENICAR ) 40 MG tablet, Take 1 tablet (40 mg total) by mouth daily., Disp: 90 tablet, Rfl: 3   omeprazole (PRILOSEC) 40 MG capsule, Take 40 mg by mouth 2 (two) times daily., Disp: , Rfl:

## 2024-11-08 NOTE — Patient Instructions (Signed)
 Masking device -- you can look on youtube or amazon

## 2024-12-14 ENCOUNTER — Ambulatory Visit (INDEPENDENT_AMBULATORY_CARE_PROVIDER_SITE_OTHER): Admitting: Otolaryngology

## 2024-12-14 ENCOUNTER — Ambulatory Visit (INDEPENDENT_AMBULATORY_CARE_PROVIDER_SITE_OTHER): Admitting: Audiology
